# Patient Record
Sex: Female | Born: 1950 | Race: Black or African American | Hispanic: No | Smoking: Former smoker
Health system: Southern US, Community
[De-identification: ages and names within clinical notes are randomized; demographics above are authoritative.]

## PROBLEM LIST (undated history)

## (undated) DIAGNOSIS — R030 Elevated blood-pressure reading, without diagnosis of hypertension: Secondary | ICD-10-CM

## (undated) DIAGNOSIS — C73 Malignant neoplasm of thyroid gland: Secondary | ICD-10-CM

## (undated) DIAGNOSIS — IMO0002 Reserved for concepts with insufficient information to code with codable children: Secondary | ICD-10-CM

## (undated) DIAGNOSIS — K219 Gastro-esophageal reflux disease without esophagitis: Secondary | ICD-10-CM

## (undated) DIAGNOSIS — G43909 Migraine, unspecified, not intractable, without status migrainosus: Secondary | ICD-10-CM

## (undated) HISTORY — DX: Reserved for concepts with insufficient information to code with codable children: IMO0002

## (undated) HISTORY — DX: Elevated blood-pressure reading, without diagnosis of hypertension: R03.0

## (undated) HISTORY — DX: Migraine, unspecified, not intractable, without status migrainosus: G43.909

## (undated) HISTORY — DX: Malignant neoplasm of thyroid gland: C73

## (undated) HISTORY — PX: TUBAL LIGATION: SHX77

---

## 1999-02-02 ENCOUNTER — Other Ambulatory Visit: Admission: RE | Admit: 1999-02-02 | Discharge: 1999-02-02 | Payer: Self-pay | Admitting: *Deleted

## 2001-02-26 ENCOUNTER — Ambulatory Visit (HOSPITAL_COMMUNITY): Admission: RE | Admit: 2001-02-26 | Discharge: 2001-02-26 | Payer: Self-pay | Admitting: Specialist

## 2001-02-26 ENCOUNTER — Encounter: Payer: Self-pay | Admitting: Specialist

## 2001-03-11 ENCOUNTER — Other Ambulatory Visit: Admission: RE | Admit: 2001-03-11 | Discharge: 2001-03-11 | Payer: Self-pay | Admitting: Specialist

## 2002-03-05 ENCOUNTER — Encounter: Payer: Self-pay | Admitting: Specialist

## 2002-03-05 ENCOUNTER — Ambulatory Visit (HOSPITAL_COMMUNITY): Admission: RE | Admit: 2002-03-05 | Discharge: 2002-03-05 | Payer: Self-pay | Admitting: Specialist

## 2003-04-07 ENCOUNTER — Encounter: Payer: Self-pay | Admitting: Specialist

## 2003-04-07 ENCOUNTER — Ambulatory Visit (HOSPITAL_COMMUNITY): Admission: RE | Admit: 2003-04-07 | Discharge: 2003-04-07 | Payer: Self-pay | Admitting: Specialist

## 2004-04-09 ENCOUNTER — Ambulatory Visit (HOSPITAL_COMMUNITY): Admission: RE | Admit: 2004-04-09 | Discharge: 2004-04-09 | Payer: Self-pay | Admitting: Specialist

## 2005-04-11 ENCOUNTER — Ambulatory Visit (HOSPITAL_COMMUNITY): Admission: RE | Admit: 2005-04-11 | Discharge: 2005-04-11 | Payer: Self-pay | Admitting: Obstetrics and Gynecology

## 2006-04-14 ENCOUNTER — Ambulatory Visit (HOSPITAL_COMMUNITY): Admission: RE | Admit: 2006-04-14 | Discharge: 2006-04-14 | Payer: Self-pay | Admitting: Obstetrics and Gynecology

## 2006-04-28 ENCOUNTER — Ambulatory Visit: Payer: Self-pay | Admitting: Gastroenterology

## 2006-04-28 ENCOUNTER — Ambulatory Visit (HOSPITAL_COMMUNITY): Admission: RE | Admit: 2006-04-28 | Discharge: 2006-04-28 | Payer: Self-pay | Admitting: Gastroenterology

## 2006-04-28 ENCOUNTER — Encounter (INDEPENDENT_AMBULATORY_CARE_PROVIDER_SITE_OTHER): Payer: Self-pay | Admitting: *Deleted

## 2007-04-17 ENCOUNTER — Ambulatory Visit (HOSPITAL_COMMUNITY): Admission: RE | Admit: 2007-04-17 | Discharge: 2007-04-17 | Payer: Self-pay | Admitting: Obstetrics and Gynecology

## 2007-04-29 ENCOUNTER — Ambulatory Visit (HOSPITAL_COMMUNITY): Admission: RE | Admit: 2007-04-29 | Discharge: 2007-04-29 | Payer: Self-pay | Admitting: Obstetrics and Gynecology

## 2007-05-14 ENCOUNTER — Encounter (HOSPITAL_COMMUNITY): Admission: RE | Admit: 2007-05-14 | Discharge: 2007-06-13 | Payer: Self-pay | Admitting: Orthopedic Surgery

## 2007-05-19 ENCOUNTER — Other Ambulatory Visit: Admission: RE | Admit: 2007-05-19 | Discharge: 2007-05-19 | Payer: Self-pay | Admitting: Obstetrics and Gynecology

## 2007-11-02 ENCOUNTER — Ambulatory Visit (HOSPITAL_COMMUNITY): Admission: RE | Admit: 2007-11-02 | Discharge: 2007-11-02 | Payer: Self-pay | Admitting: Obstetrics and Gynecology

## 2008-04-25 ENCOUNTER — Ambulatory Visit (HOSPITAL_COMMUNITY): Admission: RE | Admit: 2008-04-25 | Discharge: 2008-04-25 | Payer: Self-pay | Admitting: Obstetrics and Gynecology

## 2008-05-20 ENCOUNTER — Other Ambulatory Visit: Admission: RE | Admit: 2008-05-20 | Discharge: 2008-05-20 | Payer: Self-pay | Admitting: Obstetrics and Gynecology

## 2009-04-26 ENCOUNTER — Ambulatory Visit (HOSPITAL_COMMUNITY): Admission: RE | Admit: 2009-04-26 | Discharge: 2009-04-26 | Payer: Self-pay | Admitting: Obstetrics and Gynecology

## 2009-06-02 ENCOUNTER — Other Ambulatory Visit: Admission: RE | Admit: 2009-06-02 | Discharge: 2009-06-02 | Payer: Self-pay | Admitting: Obstetrics and Gynecology

## 2010-04-27 ENCOUNTER — Ambulatory Visit (HOSPITAL_COMMUNITY): Admission: RE | Admit: 2010-04-27 | Discharge: 2010-04-27 | Payer: Self-pay | Admitting: Obstetrics and Gynecology

## 2010-06-11 ENCOUNTER — Other Ambulatory Visit
Admission: RE | Admit: 2010-06-11 | Discharge: 2010-06-11 | Payer: Self-pay | Source: Home / Self Care | Admitting: Obstetrics and Gynecology

## 2010-11-16 NOTE — Op Note (Signed)
NAMEJAKIERA, Joyce Martin             ACCOUNT NO.:  [id number]   MEDICAL RECORD NO.:  [id number]          PATIENT TYPE:  AMB   LOCATION:  DAY                           FACILITY:  [facility]   PHYSICIAN:  Kassie Mends, M.D.      DATE OF BIRTH:  [birth year]   DATE OF PROCEDURE:  04/28/2006  DATE OF DISCHARGE:  04/28/2006                                 OPERATIVE REPORT   PROCEDURE:  Colonoscopy with cold forceps polypectomy.   INDICATION FOR EXAM:  Ms. Joyce Martin is a 60 year old female who presents for  average risk colon cancer screening.   FINDINGS:  1. 3 mm ascending colon polyp removed via cold forceps.  Otherwise no      masses, inflammatory changes or vascular ectasia seen.  2. Sigmoid colon diverticulosis.  3. Normal retroflexed view of the rectum.   RECOMMENDATIONS:  1. Will call with biopsy results.  If polyp adenomatous, then will need      screening colonoscopy in 5 years.  If the polyp is adenomatous,her      siblings and children will need their first screening colonoscopy at      age 44 and then every 5 years.  2. Handout given on diverticulosis, polyps and high fiber diet.  3. No aspirin, NSAIDs or anticoagulation for 3 days.  4. Follow with Dr. Sudie Bailey.   MEDICATIONS:  1. Demerol 100 mg IV.  2. Versed 6 mg IV.   PROCEDURE TECHNIQUE:  Physical exam was performed and informed consent was  obtained from the patient after explaining all the benefits, risks and  alternatives to the procedure.  The patient was connected to the monitor and  placed in left lateral position.  Continuous oxygen was provided by nasal  cannula and IV medicine administered through an indwelling cannula.  After  administration of sedation and rectal exam, the patient's rectum was  intubated.  The scope was advanced under direct visualization to the cecum.  The scope  was subsequently moved slowly by carefully examining  the color, texture,  anatomy, and integrity mucosa on the way out.  Patient was  recovered in  endoscopy suite and discharged home in satisfactory condition.      Kassie Mends, M.D.  Electronically Signed     SM/MEDQ  D:  04/29/2006  T:  04/30/2006  Job:  [id number]   cc:   Joyce Martin. Sudie Bailey, M.D.  Fax: [phone]

## 2011-02-28 ENCOUNTER — Emergency Department (HOSPITAL_COMMUNITY)
Admission: EM | Admit: 2011-02-28 | Discharge: 2011-02-28 | Disposition: A | Discharge status: Home / Self Care | Payer: 59 | Attending: Emergency Medicine | Admitting: Emergency Medicine

## 2011-02-28 ENCOUNTER — Emergency Department (HOSPITAL_COMMUNITY): Payer: 59

## 2011-02-28 ENCOUNTER — Encounter: Payer: Self-pay | Admitting: Emergency Medicine

## 2011-02-28 DIAGNOSIS — Y92009 Unspecified place in unspecified non-institutional (private) residence as the place of occurrence of the external cause: Secondary | ICD-10-CM | POA: Insufficient documentation

## 2011-02-28 DIAGNOSIS — S61209A Unspecified open wound of unspecified finger without damage to nail, initial encounter: Secondary | ICD-10-CM | POA: Insufficient documentation

## 2011-02-28 DIAGNOSIS — W268XXA Contact with other sharp object(s), not elsewhere classified, initial encounter: Secondary | ICD-10-CM | POA: Insufficient documentation

## 2011-02-28 DIAGNOSIS — S61219A Laceration without foreign body of unspecified finger without damage to nail, initial encounter: Secondary | ICD-10-CM

## 2011-02-28 MED ORDER — LIDOCAINE HCL (PF) 1 % IJ SOLN
INTRAMUSCULAR | Status: AC
  Administered 2011-02-28: 21:00:00
  Filled 2011-02-28: qty 5

## 2011-02-28 MED ORDER — TETANUS-DIPHTH-ACELL PERTUSSIS 5-2.5-18.5 LF-MCG/0.5 IM SUSP
0.5000 mL | Freq: Once | INTRAMUSCULAR | Status: AC
  Administered 2011-02-28: 0.5 mL via INTRAMUSCULAR
  Filled 2011-02-28 (×2): qty 0.5

## 2011-02-28 NOTE — ED Provider Notes (Signed)
History     CSN: [id number] Arrival date & time: 02/28/2011  7:00 PM  Chief Complaint  Patient presents with  . Laceration   HPI Comments: Pt was brushing grass away from the discharge shute on her lawnmower.  Her R 3rd finger was nicked by the blade.  Patient is a 60 y.o. female presenting with skin laceration. The history is provided by the patient. No language interpreter was used.  Laceration  The incident occurred 3 to 5 hours ago. The laceration is located on the right hand. The laceration is 3 cm in size. The pain is moderate. The pain has been constant since onset. She reports no foreign bodies present. Her tetanus status is out of date.    History reviewed. No pertinent past medical history.  Past Surgical History  Procedure Date  . Tubal ligation     Family History  Problem Relation Age of Onset  . Diabetes Mother   . Coronary artery disease Father   . Cancer Sister   . Diabetes Brother     History  Substance Use Topics  . Smoking status: Never Smoker   . Smokeless tobacco: Not on file  . Alcohol Use: No    OB History    Grav Para Term Preterm Abortions TAB SAB Ect Mult Living   2 2 2       2       Review of Systems  Musculoskeletal:       Finger lac  All other systems reviewed and are negative.    Physical Exam  BP 155/81  Pulse 121  Temp(Src) 98.5 F (36.9 C) (Oral)  Resp 18  Ht 5\' 8"  (1.727 m)  Wt 166 lb (75.297 kg)  BMI 25.24 kg/m2  SpO2 100%  Physical Exam  Nursing note and vitals reviewed. Constitutional: She is oriented to person, place, and time. Vital signs are normal. She appears well-developed and well-nourished. No distress.  HENT:  Head: Normocephalic and atraumatic.  Right Ear: External ear normal.  Left Ear: External ear normal.  Nose: Nose normal.  Mouth/Throat: No oropharyngeal exudate.  Eyes: Conjunctivae and EOM are normal. Pupils are equal, round, and reactive to light. Right eye exhibits no discharge. Left eye  exhibits no discharge. No scleral icterus.  Neck: Normal range of motion. Neck supple. No JVD present. No tracheal deviation present. No thyromegaly present.  Cardiovascular: Normal rate, regular rhythm, normal heart sounds, intact distal pulses and normal pulses.  Exam reveals no gallop and no friction rub.   No murmur heard. Pulmonary/Chest: Effort normal and breath sounds normal. No stridor. No respiratory distress. She has no wheezes. She has no rales. She exhibits no tenderness.  Abdominal: Soft. Normal appearance and bowel sounds are normal. She exhibits no distension and no mass. There is no tenderness. There is no rebound and no guarding.  Musculoskeletal: Normal range of motion. She exhibits tenderness. She exhibits no edema.       Hands: Lymphadenopathy:    She has no cervical adenopathy.  Neurological: She is alert and oriented to person, place, and time. She has normal reflexes. No cranial nerve deficit or sensory deficit. Coordination normal. GCS eye subscore is 4. GCS verbal subscore is 5. GCS motor subscore is 6.  Skin: Skin is warm and dry. No rash noted. She is not diaphoretic.  Psychiatric: She has a normal mood and affect. Her speech is normal and behavior is normal. Judgment and thought content normal. Cognition and memory are normal.  ED Course  LACERATION REPAIR Date/Time: 02/28/2011 8:30 PM Performed by: Worthy Rancher Authorized by: Jasmine Awe Consent: Verbal consent obtained. Written consent not obtained. Risks and benefits: risks, benefits and alternatives were discussed Consent given by: patient Patient understanding: patient states understanding of the procedure being performed Imaging studies: imaging studies available Patient identity confirmed: verbally with patient Time out: Immediately prior to procedure a "time out" was called to verify the correct patient, procedure, equipment, support staff and site/side marked as required. Laceration  length: 3 cm Contamination: The wound is contaminated. Foreign bodies: no foreign bodies Tendon involvement: none Nerve involvement: none Vascular damage: no Local anesthetic: lidocaine 1% without epinephrine Patient sedated: no Preparation: Patient was prepped and draped in the usual sterile fashion. Irrigation solution: saline Irrigation method: syringe Debridement: none Degree of undermining: none Dressing: antibiotic ointment and 4x4 sterile gauze (finger pad snipped off and packed with surgicell and then bandage.  ) Patient tolerance: Patient tolerated the procedure well with no immediate complications.    MDM       Worthy Rancher, PA 02/28/11 2045

## 2011-02-28 NOTE — ED Notes (Signed)
avulsion to right middle  finger

## 2011-02-28 NOTE — ED Notes (Signed)
Pt has laceration to right middle finger from lawnmower blade

## 2011-03-05 NOTE — ED Provider Notes (Signed)
Medical screening examination/treatment/procedure(s) were performed by non-physician practitioner and as supervising physician I was immediately available for consultation/collaboration.  Jasmine Awe, MD 03/05/11 [phone]

## 2011-03-25 ENCOUNTER — Other Ambulatory Visit: Payer: Self-pay | Admitting: Adult Health

## 2011-03-25 DIAGNOSIS — Z139 Encounter for screening, unspecified: Secondary | ICD-10-CM

## 2011-04-29 ENCOUNTER — Ambulatory Visit (HOSPITAL_COMMUNITY)
Admission: RE | Admit: 2011-04-29 | Discharge: 2011-04-29 | Disposition: A | Discharge status: Home / Self Care | Payer: 59 | Source: Ambulatory Visit | Attending: Adult Health | Admitting: Adult Health

## 2011-04-29 DIAGNOSIS — Z139 Encounter for screening, unspecified: Secondary | ICD-10-CM

## 2011-04-29 DIAGNOSIS — Z1231 Encounter for screening mammogram for malignant neoplasm of breast: Secondary | ICD-10-CM | POA: Insufficient documentation

## 2011-06-18 ENCOUNTER — Other Ambulatory Visit: Payer: Self-pay | Admitting: Adult Health

## 2011-06-18 ENCOUNTER — Other Ambulatory Visit (HOSPITAL_COMMUNITY)
Admission: RE | Admit: 2011-06-18 | Discharge: 2011-06-18 | Disposition: A | Discharge status: Home / Self Care | Payer: 59 | Source: Ambulatory Visit | Attending: Obstetrics and Gynecology | Admitting: Obstetrics and Gynecology

## 2011-06-18 DIAGNOSIS — Z01419 Encounter for gynecological examination (general) (routine) without abnormal findings: Secondary | ICD-10-CM | POA: Insufficient documentation

## 2012-03-27 ENCOUNTER — Other Ambulatory Visit: Payer: Self-pay | Admitting: Adult Health

## 2012-03-27 DIAGNOSIS — Z139 Encounter for screening, unspecified: Secondary | ICD-10-CM

## 2012-04-30 ENCOUNTER — Ambulatory Visit (HOSPITAL_COMMUNITY)
Admission: RE | Admit: 2012-04-30 | Discharge: 2012-04-30 | Disposition: A | Discharge status: Home / Self Care | Payer: 59 | Source: Ambulatory Visit | Attending: Adult Health | Admitting: Adult Health

## 2012-04-30 DIAGNOSIS — Z1231 Encounter for screening mammogram for malignant neoplasm of breast: Secondary | ICD-10-CM | POA: Insufficient documentation

## 2012-04-30 DIAGNOSIS — Z139 Encounter for screening, unspecified: Secondary | ICD-10-CM

## 2012-07-01 DIAGNOSIS — IMO0002 Reserved for concepts with insufficient information to code with codable children: Secondary | ICD-10-CM

## 2012-07-01 HISTORY — DX: Reserved for concepts with insufficient information to code with codable children: IMO0002

## 2012-07-03 ENCOUNTER — Other Ambulatory Visit (HOSPITAL_COMMUNITY)
Admission: RE | Admit: 2012-07-03 | Discharge: 2012-07-03 | Disposition: A | Discharge status: Home / Self Care | Payer: 59 | Source: Ambulatory Visit | Attending: Obstetrics and Gynecology | Admitting: Obstetrics and Gynecology

## 2012-07-03 ENCOUNTER — Other Ambulatory Visit: Payer: Self-pay | Admitting: Adult Health

## 2012-07-03 DIAGNOSIS — R87810 Cervical high risk human papillomavirus (HPV) DNA test positive: Secondary | ICD-10-CM | POA: Insufficient documentation

## 2012-07-03 DIAGNOSIS — Z01419 Encounter for gynecological examination (general) (routine) without abnormal findings: Secondary | ICD-10-CM | POA: Insufficient documentation

## 2012-07-03 DIAGNOSIS — Z1151 Encounter for screening for human papillomavirus (HPV): Secondary | ICD-10-CM | POA: Insufficient documentation

## 2013-03-22 ENCOUNTER — Other Ambulatory Visit: Payer: Self-pay | Admitting: Adult Health

## 2013-03-22 DIAGNOSIS — Z139 Encounter for screening, unspecified: Secondary | ICD-10-CM

## 2013-05-03 ENCOUNTER — Ambulatory Visit (HOSPITAL_COMMUNITY)
Admission: RE | Admit: 2013-05-03 | Discharge: 2013-05-03 | Disposition: A | Discharge status: Home / Self Care | Payer: 59 | Source: Ambulatory Visit | Attending: Adult Health | Admitting: Adult Health

## 2013-05-03 DIAGNOSIS — Z1231 Encounter for screening mammogram for malignant neoplasm of breast: Secondary | ICD-10-CM | POA: Insufficient documentation

## 2013-05-03 DIAGNOSIS — Z139 Encounter for screening, unspecified: Secondary | ICD-10-CM

## 2013-07-09 ENCOUNTER — Ambulatory Visit (INDEPENDENT_AMBULATORY_CARE_PROVIDER_SITE_OTHER): Payer: 59 | Admitting: Adult Health

## 2013-07-09 ENCOUNTER — Encounter (INDEPENDENT_AMBULATORY_CARE_PROVIDER_SITE_OTHER): Payer: Self-pay

## 2013-07-09 ENCOUNTER — Other Ambulatory Visit (HOSPITAL_COMMUNITY)
Admission: RE | Admit: 2013-07-09 | Discharge: 2013-07-09 | Disposition: A | Discharge status: Home / Self Care | Payer: 59 | Source: Ambulatory Visit | Attending: Adult Health | Admitting: Adult Health

## 2013-07-09 ENCOUNTER — Encounter: Payer: Self-pay | Admitting: Adult Health

## 2013-07-09 VITALS — BP 148/84 | HR 72 | Ht 66.0 in | Wt 176.0 lb

## 2013-07-09 DIAGNOSIS — Z1151 Encounter for screening for human papillomavirus (HPV): Secondary | ICD-10-CM | POA: Insufficient documentation

## 2013-07-09 DIAGNOSIS — IMO0002 Reserved for concepts with insufficient information to code with codable children: Secondary | ICD-10-CM | POA: Insufficient documentation

## 2013-07-09 DIAGNOSIS — Z01419 Encounter for gynecological examination (general) (routine) without abnormal findings: Secondary | ICD-10-CM | POA: Insufficient documentation

## 2013-07-09 DIAGNOSIS — Z1212 Encounter for screening for malignant neoplasm of rectum: Secondary | ICD-10-CM

## 2013-07-09 LAB — HEMOCCULT GUIAC POC 1CARD (OFFICE): Fecal Occult Blood, POC: NEGATIVE

## 2013-07-09 NOTE — Patient Instructions (Signed)
Physical in 1 year Mammogram yearly Colonoscopy  As per GI

## 2013-07-09 NOTE — Progress Notes (Signed)
Patient ID: Joyce Martin, female   DOB: Sep 24, 1950, 63 y.o.   MRN: [id number] History of Present Illness: Joyce Martin is a 63 year old black female in for pap and physical, she had a normal pap las year but had +HPV. No complaints.Walks daily.   Current Medications, Allergies, Past Medical History, Past Surgical History, Family History and Social History were reviewed in [facility] record.     Review of Systems: Patient denies any headaches, blurred vision, shortness of breath, chest pain, abdominal pain, problems with bowel movements, urination, or intercourse.No joint pain or mood swings, does have pain left breast that goes and comes, had normal mammogram in 05/2013.   BP 148/84  Pulse 72  Ht 5\' 6"  (1.676 m)  Wt 176 lb (79.833 kg)  BMI 28.42 kg/m2 Physical Exam: General:  Well developed, well nourished, no acute distress Skin:  Warm and dry Neck:  Midline trachea, normal thyroid, no carotid bruits Lungs; Clear to auscultation bilaterally Breast:  No dominant palpable mass, retraction, or nipple discharge Cardiovascular: Regular rate and rhythm Abdomen:  Soft, non tender, no hepatosplenomegaly Pelvic:  External genitalia is normal in appearance.  The vagina is normal in appearance for age.               The cervix is atrophic, pap performed with HPV.  Uterus is felt to be normal size, shape, and contour.  No  adnexal masses or tenderness noted. Rectal: Good sphincter tone, no polyps, or hemorrhoids felt.  Hemoccult negative.Has mild rectocele Extremities:  No swelling or varicosities noted Psych:  No mood changes, alert and cooperative,seems happy, still working at [employer] hopes to retire this year.   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

## 2014-04-06 ENCOUNTER — Other Ambulatory Visit: Payer: Self-pay | Admitting: Adult Health

## 2014-04-06 DIAGNOSIS — Z1231 Encounter for screening mammogram for malignant neoplasm of breast: Secondary | ICD-10-CM

## 2014-05-02 ENCOUNTER — Encounter: Payer: Self-pay | Admitting: Adult Health

## 2014-05-04 ENCOUNTER — Ambulatory Visit (HOSPITAL_COMMUNITY): Payer: 59

## 2014-05-16 ENCOUNTER — Ambulatory Visit (HOSPITAL_COMMUNITY)
Admission: RE | Admit: 2014-05-16 | Discharge: 2014-05-16 | Disposition: A | Discharge status: Home / Self Care | Payer: 59 | Source: Ambulatory Visit | Attending: Adult Health | Admitting: Adult Health

## 2014-05-16 DIAGNOSIS — Z1231 Encounter for screening mammogram for malignant neoplasm of breast: Secondary | ICD-10-CM

## 2014-07-26 ENCOUNTER — Encounter: Payer: Self-pay | Admitting: Adult Health

## 2014-07-26 ENCOUNTER — Ambulatory Visit (INDEPENDENT_AMBULATORY_CARE_PROVIDER_SITE_OTHER): Payer: 59 | Admitting: Adult Health

## 2014-07-26 VITALS — BP 170/90 | HR 76 | Ht 67.0 in | Wt 179.0 lb

## 2014-07-26 DIAGNOSIS — R030 Elevated blood-pressure reading, without diagnosis of hypertension: Secondary | ICD-10-CM

## 2014-07-26 DIAGNOSIS — IMO0001 Reserved for inherently not codable concepts without codable children: Secondary | ICD-10-CM

## 2014-07-26 DIAGNOSIS — Z1212 Encounter for screening for malignant neoplasm of rectum: Secondary | ICD-10-CM

## 2014-07-26 DIAGNOSIS — Z01419 Encounter for gynecological examination (general) (routine) without abnormal findings: Secondary | ICD-10-CM

## 2014-07-26 HISTORY — DX: Reserved for inherently not codable concepts without codable children: IMO0001

## 2014-07-26 LAB — HEMOCCULT GUIAC POC 1CARD (OFFICE): Fecal Occult Blood, POC: NEGATIVE

## 2014-07-26 NOTE — Patient Instructions (Signed)
Mammogram yearly Physical in 1 year Labs with PCP Follow up with Dr Karie Kirks about BP Colonoscopy per GI

## 2014-07-26 NOTE — Progress Notes (Signed)
Patient ID: Joyce Martin, female   DOB: 1951-03-02, 64 y.o.   MRN: [id number] History of Present Illness: Joyce Martin is a 64 year old black female in for gyn exam and complains of hot flashes esp at night.She had a normal pap with negative HPV 07/09/13.Has had sinus infection and is better, has headache occasionally. May retire next year.  Current Medications, Allergies, Past Medical History, Past Surgical History, Family History and Social History were reviewed in [facility] record.     Review of Systems: Patient denies any daily headaches, blurred vision, shortness of breath, chest pain, abdominal pain, problems with bowel movements, urination, or intercourse. No joint pain or mood swings.See HPI for positives.    Physical Exam:BP 170/90 mmHg  Pulse 76  Ht 5\' 7"  (1.702 m)  Wt 179 lb (81.194 kg)  BMI 28.03 kg/m2BP recheck 170/90 General:  Well developed, well nourished, no acute distress Skin:  Warm and dry Neck:  Midline trachea, normal thyroid Lungs; Clear to auscultation bilaterally Breast:  No dominant palpable mass, retraction, or nipple discharge Cardiovascular: Regular rate and rhythm Abdomen:  Soft, non tender, no hepatosplenomegaly Pelvic:  External genitalia is normal in appearance, no lesions.  The vagina has decrease color, moisture and rugae.The cervix is smooth.  Uterus is felt to be normal size, shape, and contour.  No  adnexal masses or tenderness noted. Rectal: Good sphincter tone, no polyps, or hemorrhoids felt.  Hemoccult negative. Extremities:  No swelling or varicosities noted Psych:  No mood changes,alert and cooperative,seems happy  Impression: Well woman gyn exam no pap Elevated BP    Plan: See Dr Joyce Martin about BP Physical in 1 year Mammogram yearly colonoscopy per GI Labs with PCP

## 2014-10-17 ENCOUNTER — Ambulatory Visit (INDEPENDENT_AMBULATORY_CARE_PROVIDER_SITE_OTHER): Payer: 59

## 2014-10-17 ENCOUNTER — Ambulatory Visit (INDEPENDENT_AMBULATORY_CARE_PROVIDER_SITE_OTHER): Payer: 59 | Admitting: Podiatry

## 2014-10-17 ENCOUNTER — Encounter: Payer: Self-pay | Admitting: Podiatry

## 2014-10-17 VITALS — BP 171/98 | HR 81 | Resp 12

## 2014-10-17 DIAGNOSIS — M7742 Metatarsalgia, left foot: Secondary | ICD-10-CM | POA: Diagnosis not present

## 2014-10-17 DIAGNOSIS — R52 Pain, unspecified: Secondary | ICD-10-CM | POA: Diagnosis not present

## 2014-10-17 NOTE — Patient Instructions (Signed)
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

## 2014-10-17 NOTE — Progress Notes (Signed)
   Subjective:    Patient ID: Joyce Martin, female    DOB: 1950/12/24, 64 y.o.   MRN: [id number]  HPI N-SORE L-LT BALL OF THE FOOT D-6 WEEKS O-SUDDENLY C-SAME A-PRESSURE T-GEL PADS, CUSTOM MADE INSERTS . 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 The pain increases with standing walking and is relieved with rest.  Review of Systems  HENT: Positive for sinus pressure.   Neurological: Positive for headaches.  Hematological: Bruises/bleeds easily.  All other systems reviewed and are negative.      Objective:   Physical Exam  Orientated 3  Vascular: DP and PT pulses 2/bilaterally  Neurological: Ankle reflex equal and reactive bilaterally Vibratory sensation reactive bilaterally Sensation to 10 g monofilament wire intact 5/5 bilaterally  Dermatological: Texture and turgor within normal limits bilaterally  Musculoskeletal: HAV deformities bilaterally Palpable tenderness plantar subsecond MPJ without any palpable lesions Tenderness on range of motion second MPJ left without any crepitus  X-ray examination weightbearing left foot  Intact bony structure without fracture and/or dislocation Posterior and inferior calcaneal spurs Bone density appears adequate Joint spaces appear adequate  Radiographic impression: No acute bony abnormality noted in the left foot       Assessment & Plan:   Assessment: Capsulitis subsecond MPJ/metatarsalgia left  Plan: Advised patient that she has a low-grade inflammation on her left foot that often lingers 6-12 months Dispensed a pair of universal gel wraps for to wear in her shoes  I made aware of the symptoms were not improving also would consider the use of a custom foot orthotic with a metatarsal raise  Reappoint when necessary at patient's request

## 2015-04-17 ENCOUNTER — Other Ambulatory Visit: Payer: Self-pay | Admitting: Adult Health

## 2015-04-17 DIAGNOSIS — Z1231 Encounter for screening mammogram for malignant neoplasm of breast: Secondary | ICD-10-CM

## 2015-04-28 ENCOUNTER — Other Ambulatory Visit (HOSPITAL_COMMUNITY): Payer: Self-pay | Admitting: Family Medicine

## 2015-04-28 DIAGNOSIS — Z780 Asymptomatic menopausal state: Secondary | ICD-10-CM

## 2015-05-03 ENCOUNTER — Ambulatory Visit (HOSPITAL_COMMUNITY)
Admission: RE | Admit: 2015-05-03 | Discharge: 2015-05-03 | Disposition: A | Discharge status: Home / Self Care | Payer: Medicare Other | Source: Ambulatory Visit | Attending: Family Medicine | Admitting: Family Medicine

## 2015-05-03 DIAGNOSIS — Z780 Asymptomatic menopausal state: Secondary | ICD-10-CM | POA: Insufficient documentation

## 2015-05-19 ENCOUNTER — Ambulatory Visit (HOSPITAL_COMMUNITY)
Admission: RE | Admit: 2015-05-19 | Discharge: 2015-05-19 | Disposition: A | Discharge status: Home / Self Care | Payer: Medicare Other | Source: Ambulatory Visit | Attending: Adult Health | Admitting: Adult Health

## 2015-05-19 DIAGNOSIS — Z1231 Encounter for screening mammogram for malignant neoplasm of breast: Secondary | ICD-10-CM | POA: Insufficient documentation

## 2015-05-24 ENCOUNTER — Other Ambulatory Visit (HOSPITAL_COMMUNITY): Payer: Self-pay | Admitting: Family Medicine

## 2015-05-24 ENCOUNTER — Ambulatory Visit (HOSPITAL_COMMUNITY)
Admission: RE | Admit: 2015-05-24 | Discharge: 2015-05-24 | Disposition: A | Discharge status: Home / Self Care | Payer: Medicare Other | Source: Ambulatory Visit | Attending: Family Medicine | Admitting: Family Medicine

## 2015-05-24 DIAGNOSIS — R0789 Other chest pain: Secondary | ICD-10-CM

## 2015-08-07 ENCOUNTER — Encounter: Payer: Self-pay | Admitting: Adult Health

## 2015-08-07 ENCOUNTER — Ambulatory Visit (INDEPENDENT_AMBULATORY_CARE_PROVIDER_SITE_OTHER): Payer: Medicare Other | Admitting: Adult Health

## 2015-08-07 VITALS — BP 140/84 | HR 90 | Ht 66.0 in | Wt 179.0 lb

## 2015-08-07 DIAGNOSIS — Z1212 Encounter for screening for malignant neoplasm of rectum: Secondary | ICD-10-CM | POA: Diagnosis not present

## 2015-08-07 DIAGNOSIS — Z01419 Encounter for gynecological examination (general) (routine) without abnormal findings: Secondary | ICD-10-CM | POA: Diagnosis not present

## 2015-08-07 DIAGNOSIS — R030 Elevated blood-pressure reading, without diagnosis of hypertension: Secondary | ICD-10-CM

## 2015-08-07 DIAGNOSIS — IMO0001 Reserved for inherently not codable concepts without codable children: Secondary | ICD-10-CM

## 2015-08-07 LAB — HEMOCCULT GUIAC POC 1CARD (OFFICE): FECAL OCCULT BLD: NEGATIVE

## 2015-08-07 NOTE — Addendum Note (Signed)
Addended by: Doyne Keel on: 08/07/2015 02:44 PM   Modules accepted: Orders

## 2015-08-07 NOTE — Progress Notes (Signed)
Patient ID: Joyce Martin, female   DOB: 06/06/1951, 65 y.o.   MRN: [id number] History of Present Illness: Joyce Martin is a 65 year old black female in for well woman gyn exam, she had a normal pap with negative HPV 07/09/13.Had sinus headache today, declines flu shot.She has retired. PCP is Dr Karie Kirks.   Current Medications, Allergies, Past Medical History, Past Surgical History, Family History and Social History were reviewed in [employer] record.     Review of Systems: Patient denies any daily headaches, hearing loss, fatigue, blurred vision, shortness of breath, chest pain, abdominal pain, problems with bowel movements, urination, or intercourse. No joint pain or mood swings.    Physical Exam:BP 140/84 mmHg  Pulse 90  Ht 5\' 6"  (1.676 m)  Wt 179 lb (81.194 kg)  BMI 28.91 kg/m2 General:  Well developed, well nourished, no acute distress Skin:  Warm and dry Neck:  Midline trachea, normal thyroid, good ROM, no lymphadenopathy, no carotid bruits heard Lungs; Clear to auscultation bilaterally Breast:  No dominant palpable mass, retraction, or nipple discharge Cardiovascular: Regular rate and rhythm Abdomen:  Soft, non tender, no hepatosplenomegaly Pelvic:  External genitalia is normal in appearance, no lesions.  The vagina has decreased color, moisture and rugae. Urethra has no lesions or masses. The cervix is smooth.  Uterus is felt to be normal size, shape, and contour.  No adnexal masses or tenderness noted.Bladder is non tender, no masses felt. Rectal: Good sphincter tone, no polyps, or hemorrhoids felt.  Hemoccult negative. Extremities/musculoskeletal:  No swelling or varicosities noted, no clubbing or cyanosis Psych:  No mood changes, alert and cooperative,seems happy   Impression: Well woman gyn exam no pap BP elevated     Plan: Labs with PCP Pap and physical in 2 years Mammogram yearly Colonoscopy per GI

## 2015-08-07 NOTE — Patient Instructions (Signed)
Pap and physical in 2 year Mammogram yearly  Labs with PCP  Colonoscopy per GI

## 2016-02-28 ENCOUNTER — Telehealth: Payer: Self-pay | Admitting: Gastroenterology

## 2016-02-28 NOTE — Telephone Encounter (Signed)
Letter mailed

## 2016-02-28 NOTE — Telephone Encounter (Signed)
Pt is due 10 yr colonoscopy °

## 2016-03-06 ENCOUNTER — Telehealth: Payer: Self-pay

## 2016-03-12 NOTE — Telephone Encounter (Signed)
Gastroenterology Pre-Procedure Review  Request Date: 03/06/2016 Requesting Physician: ON RECALL  PATIENT REVIEW QUESTIONS: The patient responded to the following health history questions as indicated:    1. Diabetes Melitis: no 2. Joint replacements in the past 12 months: no 3. Major health problems in the past 3 months: no 4. Has an artificial valve or MVP: no 5. Has a defibrillator: no 6. Has been advised in past to take antibiotics in advance of a procedure like teeth cleaning: no 7. Family history of colon cancer: no  8. Alcohol Use: no 9. History of sleep apnea: no     MEDICATIONS & ALLERGIES:    Patient reports the following regarding taking any blood thinners:   Plavix? no Aspirin? no Coumadin? no  Patient confirms/reports the following medications:  Current Outpatient Prescriptions  Medication Sig Dispense Refill  . ibuprofen (ADVIL,MOTRIN) 200 MG tablet Take 200 mg by mouth as needed.    Marland Kitchen PRESCRIPTION MEDICATION Pt taking medication for acid reflux, unsure of name     No current facility-administered medications for this visit.     Patient confirms/reports the following allergies:  Allergies  Allergen Reactions  . Other     COMBID: for stomach     No orders of the defined types were placed in this encounter.   AUTHORIZATION INFORMATION Primary Insurance:   ID #:  Group #:  Pre-Cert / Auth required: Pre-Cert / Auth #:   Secondary Insurance:   ID #:   Group #:  Pre-Cert / Auth #:   SCHEDULE INFORMATION: Procedure has been scheduled as follows:  Date:              Time:   Location:   This Gastroenterology Pre-Precedure Review Form is being routed to the following provider(s): Barney Drain, MD

## 2016-03-15 NOTE — Telephone Encounter (Signed)
SUPREP SPLIT DOSING-REGULAR breakfast then CLEAR LIQUIDS after 9 am.  HOLD IBUPROFEN ON DAY BEFORE AND DAY OF COLONOSCOPY.

## 2016-03-20 ENCOUNTER — Other Ambulatory Visit: Payer: Self-pay

## 2016-03-20 DIAGNOSIS — Z1211 Encounter for screening for malignant neoplasm of colon: Secondary | ICD-10-CM

## 2016-03-20 MED ORDER — NA SULFATE-K SULFATE-MG SULF 17.5-3.13-1.6 GM/177ML PO SOLN: 1.0000 | ORAL | 0 refills | Status: DC

## 2016-03-20 NOTE — Telephone Encounter (Signed)
Rx sent to the pharmacy and instructions mailed to pt.  

## 2016-03-20 NOTE — Telephone Encounter (Signed)
LMOM to call.

## 2016-04-15 ENCOUNTER — Other Ambulatory Visit: Payer: Self-pay | Admitting: Adult Health

## 2016-04-15 DIAGNOSIS — Z1231 Encounter for screening mammogram for malignant neoplasm of breast: Secondary | ICD-10-CM

## 2016-04-16 ENCOUNTER — Telehealth: Payer: Self-pay

## 2016-04-16 NOTE — Telephone Encounter (Signed)
See documentation on 04/09/2016 by Tretha Sciara for the PA #. For the colonoscopy.

## 2016-04-18 ENCOUNTER — Ambulatory Visit (HOSPITAL_COMMUNITY)
Admission: RE | Admit: 2016-04-18 | Discharge: 2016-04-18 | Disposition: A | Discharge status: Home / Self Care | Payer: Medicare Other | Source: Ambulatory Visit | Attending: Gastroenterology | Admitting: Gastroenterology

## 2016-04-18 ENCOUNTER — Encounter (HOSPITAL_COMMUNITY): Payer: Self-pay | Admitting: *Deleted

## 2016-04-18 ENCOUNTER — Encounter (HOSPITAL_COMMUNITY)
Admission: RE | Disposition: A | Discharge status: Home / Self Care | Payer: Self-pay | Source: Ambulatory Visit | Attending: Gastroenterology

## 2016-04-18 DIAGNOSIS — Z1211 Encounter for screening for malignant neoplasm of colon: Secondary | ICD-10-CM | POA: Diagnosis not present

## 2016-04-18 DIAGNOSIS — K219 Gastro-esophageal reflux disease without esophagitis: Secondary | ICD-10-CM | POA: Diagnosis not present

## 2016-04-18 DIAGNOSIS — Z1212 Encounter for screening for malignant neoplasm of rectum: Secondary | ICD-10-CM

## 2016-04-18 DIAGNOSIS — Z87891 Personal history of nicotine dependence: Secondary | ICD-10-CM | POA: Insufficient documentation

## 2016-04-18 DIAGNOSIS — K648 Other hemorrhoids: Secondary | ICD-10-CM | POA: Insufficient documentation

## 2016-04-18 DIAGNOSIS — D122 Benign neoplasm of ascending colon: Secondary | ICD-10-CM | POA: Diagnosis not present

## 2016-04-18 DIAGNOSIS — K5730 Diverticulosis of large intestine without perforation or abscess without bleeding: Secondary | ICD-10-CM | POA: Insufficient documentation

## 2016-04-18 HISTORY — DX: Gastro-esophageal reflux disease without esophagitis: K21.9

## 2016-04-18 HISTORY — PX: COLONOSCOPY: SHX5424

## 2016-04-18 SURGERY — COLONOSCOPY
Anesthesia: Moderate Sedation

## 2016-04-18 MED ORDER — MEPERIDINE HCL 100 MG/ML IJ SOLN
INTRAMUSCULAR | Status: AC
  Filled 2016-04-18: qty 2

## 2016-04-18 MED ORDER — MEPERIDINE HCL 100 MG/ML IJ SOLN
INTRAMUSCULAR | Status: DC | PRN
  Administered 2016-04-18 (×4): 25 mg via INTRAVENOUS

## 2016-04-18 MED ORDER — MIDAZOLAM HCL 5 MG/5ML IJ SOLN
INTRAMUSCULAR | Status: AC
  Filled 2016-04-18: qty 10

## 2016-04-18 MED ORDER — SODIUM CHLORIDE 0.9 % IV SOLN
INTRAVENOUS | Status: DC
  Administered 2016-04-18: 09:00:00 via INTRAVENOUS

## 2016-04-18 MED ORDER — MIDAZOLAM HCL 5 MG/5ML IJ SOLN
INTRAMUSCULAR | Status: DC | PRN
Start: 2016-04-18 — End: 2016-04-18
  Administered 2016-04-18: 2 mg via INTRAVENOUS
  Administered 2016-04-18: 1 mg via INTRAVENOUS
  Administered 2016-04-18: 2 mg via INTRAVENOUS

## 2016-04-18 MED ORDER — STERILE WATER FOR IRRIGATION IR SOLN
Status: DC | PRN
  Administered 2016-04-18: 10:00:00

## 2016-04-18 NOTE — Discharge Instructions (Signed)
You have MODERATE linternal hemorrhoids, WHICH CAN CAUSE RECTAL BLEEDING. YOU HAVE diverticulosis IN YOUR LEFT COLON. ONE SMALL POLYP WAS REMOVED.    DRINK WATER TO KEEP YOUR URINE LIGHT YELLOW.  FOLLOW A HIGH FIBER DIET. AVOID ITEMS THAT CAUSE BLOATING. See info below.  USE PREPARATION H FOUR TIMES  A DAY IF NEEDED TO RELIEVE RECTAL PAIN/PRESSURE/BLEEDING.   YOUR BIOPSY RESULTS WILL BE AVAILABLE IN MY CHART  OCT 23 AND MY OFFICE WILL CONTACT YOU IN 10-14 DAYS WITH YOUR RESULTS.   Next colonoscopy in 5-10 years.  Colonoscopy Care After Read the instructions outlined below and refer to this sheet in the next week. These discharge instructions provide you with general information on caring for yourself after you leave the hospital. While your treatment has been planned according to the most current medical practices available, unavoidable complications occasionally occur. If you have any problems or questions after discharge, call DR. Claudell Wohler, [phone].  ACTIVITY  You may resume your regular activity, but move at a slower pace for the next 24 hours.   Take frequent rest periods for the next 24 hours.   Walking will help get rid of the air and reduce the bloated feeling in your belly (abdomen).   No driving for 24 hours (because of the medicine (anesthesia) used during the test).   You may shower.   Do not sign any important legal documents or operate any machinery for 24 hours (because of the anesthesia used during the test).    NUTRITION  Drink plenty of fluids.   You may resume your normal diet as instructed by your doctor.   Begin with a light meal and progress to your normal diet. Heavy or fried foods are harder to digest and may make you feel sick to your stomach (nauseated).   Avoid alcoholic beverages for 24 hours or as instructed.    MEDICATIONS  You may resume your normal medications.   WHAT YOU CAN EXPECT TODAY  Some feelings of bloating in the abdomen.     Passage of more gas than usual.   Spotting of blood in your stool or on the toilet paper  .  IF YOU HAD POLYPS REMOVED DURING THE COLONOSCOPY:  Eat a soft diet IF YOU HAVE NAUSEA, BLOATING, ABDOMINAL PAIN, OR VOMITING.    FINDING OUT THE RESULTS OF YOUR TEST Not all test results are available during your visit. DR. Oneida Alar WILL CALL YOU WITHIN 14 DAYS OF YOUR PROCEDUE WITH YOUR RESULTS. Do not assume everything is normal if you have not heard from DR. Armen Waring, CALL HER OFFICE AT [phone].  SEEK IMMEDIATE MEDICAL ATTENTION AND CALL THE OFFICE: [phone] IF:  You have more than a spotting of blood in your stool.   Your belly is swollen (abdominal distention).   You are nauseated or vomiting.   You have a temperature over 101F.   You have abdominal pain or discomfort that is severe or gets worse throughout the day.  High-Fiber Diet A high-fiber diet changes your normal diet to include more whole grains, legumes, fruits, and vegetables. Changes in the diet involve replacing refined carbohydrates with unrefined foods. The calorie level of the diet is essentially unchanged. The Dietary Reference Intake (recommended amount) for adult males is 38 grams per day. For adult females, it is 25 grams per day. Pregnant and lactating women should consume 28 grams of fiber per day. Fiber is the intact part of a plant that is not broken down during digestion. Functional fiber  is fiber that has been isolated from the plant to provide a beneficial effect in the body. PURPOSE  Increase stool bulk.   Ease and regulate bowel movements.   Lower cholesterol.  REDUCE RISK OF COLON CANCER  INDICATIONS THAT YOU NEED MORE FIBER  Constipation and hemorrhoids.   Uncomplicated diverticulosis (intestine condition) and irritable bowel syndrome.   Weight management.   As a protective measure against hardening of the arteries (atherosclerosis), diabetes, and cancer.   GUIDELINES FOR INCREASING  FIBER IN THE DIET  Start adding fiber to the diet slowly. A gradual increase of about 5 more grams (2 slices of whole-wheat bread, 2 servings of most fruits or vegetables, or 1 bowl of high-fiber cereal) per day is best. Too rapid an increase in fiber may result in constipation, flatulence, and bloating.   Drink enough water and fluids to keep your urine clear or pale yellow. Water, juice, or caffeine-free drinks are recommended. Not drinking enough fluid may cause constipation.   Eat a variety of high-fiber foods rather than one type of fiber.   Try to increase your intake of fiber through using high-fiber foods rather than fiber pills or supplements that contain small amounts of fiber.   The goal is to change the types of food eaten. Do not supplement your present diet with high-fiber foods, but replace foods in your present diet.   INCLUDE A VARIETY OF FIBER SOURCES  Replace refined and processed grains with whole grains, canned fruits with fresh fruits, and incorporate other fiber sources. White rice, white breads, and most bakery goods contain little or no fiber.   Brown whole-grain rice, buckwheat oats, and many fruits and vegetables are all good sources of fiber. These include: broccoli, Brussels sprouts, cabbage, cauliflower, beets, sweet potatoes, white potatoes (skin on), carrots, tomatoes, eggplant, squash, berries, fresh fruits, and dried fruits.   Cereals appear to be the richest source of fiber. Cereal fiber is found in whole grains and bran. Bran is the fiber-rich outer coat of cereal grain, which is largely removed in refining. In whole-grain cereals, the bran remains. In breakfast cereals, the largest amount of fiber is found in those with "bran" in their names. The fiber content is sometimes indicated on the label.   You may need to include additional fruits and vegetables each day.   In baking, for 1 cup white flour, you may use the following substitutions:   1 cup  whole-wheat flour minus 2 tablespoons.   1/2 cup white flour plus 1/2 cup whole-wheat flour.   Polyps, Colon  A polyp is extra tissue that grows inside your body. Colon polyps grow in the large intestine. The large intestine, also called the colon, is part of your digestive system. It is a long, hollow tube at the end of your digestive tract where your body makes and stores stool. Most polyps are not dangerous. They are benign. This means they are not cancerous. But over time, some types of polyps can turn into cancer. Polyps that are smaller than a pea are usually not harmful. But larger polyps could someday become or may already be cancerous. To be safe, doctors remove all polyps and test them.   PREVENTION There is not one sure way to prevent polyps. You might be able to lower your risk of getting them if you:  Eat more fruits and vegetables and less fatty food.   Do not smoke.   Avoid alcohol.   Exercise every day.   Lose weight if  you are overweight.   Eating more calcium and folate can also lower your risk of getting polyps. Some foods that are rich in calcium are milk, cheese, and broccoli. Some foods that are rich in folate are chickpeas, kidney beans, and spinach.    Diverticulosis Diverticulosis is a common condition that develops when small pouches (diverticula) form in the wall of the colon. The risk of diverticulosis increases with age. It happens more often in people who eat a low-fiber diet. Most individuals with diverticulosis have no symptoms. Those individuals with symptoms usually experience belly (abdominal) pain, constipation, or loose stools (diarrhea).  HOME CARE INSTRUCTIONS  Increase the amount of fiber in your diet as directed by your caregiver or dietician. This may reduce symptoms of diverticulosis.   Drink at least 6 to 8 glasses of water each day to prevent constipation.   Try not to strain when you have a bowel movement.   Avoiding nuts and seeds to  prevent complications is NOT NECESSARY.     FOODS HAVING HIGH FIBER CONTENT INCLUDE:  Fruits. Apple, peach, pear, tangerine, raisins, prunes.   Vegetables. Brussels sprouts, asparagus, broccoli, cabbage, carrot, cauliflower, romaine lettuce, spinach, summer squash, tomato, winter squash, zucchini.   Starchy Vegetables. Baked beans, kidney beans, lima beans, split peas, lentils, potatoes (with skin).   Grains. Whole wheat bread, brown rice, bran flake cereal, plain oatmeal, white rice, shredded wheat, bran muffins.    SEEK IMMEDIATE MEDICAL CARE IF:  You develop increasing pain or severe bloating.   You have an oral temperature above 101F.   You develop vomiting or bowel movements that are bloody or black.   Hemorrhoids Hemorrhoids are dilated (enlarged) veins around the rectum. Sometimes clots will form in the veins. This makes them swollen and painful. These are called thrombosed hemorrhoids. Causes of hemorrhoids include:  Constipation.   Straining to have a bowel movement.   HEAVY LIFTING  HOME CARE INSTRUCTIONS  Eat a well balanced diet and drink 6 to 8 glasses of water every day to avoid constipation. You may also use a bulk laxative.   Avoid straining to have bowel movements.   Keep anal area dry and clean.   Do not use a donut shaped pillow or sit on the toilet for long periods. This increases blood pooling and pain.   Move your bowels when your body has the urge; this will require less straining and will decrease pain and pressure.

## 2016-04-18 NOTE — H&P (Signed)
Primary Care Physician:  Robert Bellow, MD Primary Gastroenterologist:  Dr. Oneida Alar  Pre-Procedure History & Physical: HPI:  Joyce Martin is a 65 y.o. female here for [place].  Past Medical History:  Diagnosis Date  . Elevated BP 07/26/2014  . GERD (gastroesophageal reflux disease)   . Migraines   . 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 TUBAL LIGATION      Prior to Admission medications   Medication Sig Start Date End Date Taking? Authorizing Provider  cromolyn (OPTICROM) 4 % ophthalmic solution Place 1 drop into both eyes every other day.   Yes Historical Provider, MD  ibuprofen (ADVIL,MOTRIN) 200 MG tablet Take 400 mg by mouth daily as needed for headache.    Yes Historical Provider, MD  Na Sulfate-K Sulfate-Mg Sulf (SUPREP BOWEL PREP KIT) 17.5-3.13-1.6 GM/180ML SOLN Take 1 kit by mouth as directed. 03/20/16  Yes Danie Binder, MD  pantoprazole (PROTONIX) 40 MG tablet Take 40 mg by mouth daily as needed (indigestion).   Yes Historical Provider, MD    Allergies as of 03/20/2016 - Review Complete 03/06/2016  Allergen Reaction Noted  . Other  02/28/2011    Family History  Problem Relation Age of Onset  . Diabetes Mother   . Hypertension Mother   . Heart disease Father     heart attack  . Cancer Sister     gallbladder  . Diabetes Brother   . Diabetes Daughter     borderline  . Diabetes Maternal Grandmother   . Diabetes Paternal Grandmother   . Diabetes Paternal Grandfather   . COPD Brother   . Hypertension Brother   . Parkinson's disease Brother   . Cancer Brother   . Colon cancer Neg Hx     Social History   Social History  . Marital status: Divorced    Spouse name: N/A  . Number of children: N/A  . Years of education: N/A   Occupational History  . Not on file.   Social History Main Topics  . Smoking status: Former Smoker    Packs/day: 0.25    Years:  5.00    Types: Cigarettes  . Smokeless tobacco: Never Used  . Alcohol use No  . Drug use: No  . Sexual activity: Yes    Birth control/ protection: Post-menopausal   Other Topics Concern  . Not on file   Social History Narrative  . No narrative on file    Review of Systems: See HPI, otherwise negative ROS   Physical Exam: BP (!) 182/90   Pulse (!) 110   Temp 97.9 F (36.6 C) (Oral)   Resp 20   Ht _0  (1.727 m)   Wt 162 lb (73.5 kg)   SpO2 100%   BMI 24.63 kg/m  General:   Alert,  pleasant and cooperative in NAD Head:  Normocephalic and atraumatic. Neck:  Supple; Lungs:  Clear throughout to auscultation.    Heart:  Regular rate and rhythm. Abdomen:  Soft, nontender and nondistended. Normal bowel sounds, without guarding, and without rebound.   Neurologic:  Alert and  oriented x4;  grossly normal neurologically.  Impression/Plan:     SCREENING  Plan:  1. TCS TODAY. DISCUSSED PROCEDURE, BENEFITS, & RISKS: < 1% chance of medication reaction, bleeding, perforation, or rupture of spleen/liver.

## 2016-04-18 NOTE — Op Note (Signed)
[facility] Patient Name: Joyce Martin Procedure Date: 04/18/2016 9:33 AM MRN: [id number] Date of Birth: [birth year] Attending MD: Barney Drain , MD CSN: [id number] Age: 65 Admit Type: Outpatient Procedure:                Colonoscopy with COLD FORCEPS POLYPECTOMY Indications:              Screening for colorectal malignant neoplasm Providers:                Barney Drain, MD, Janeece Riggers, RN, Isabella Stalling,                            Technician Referring MD:             Newt Minion, MD Medicines:                Meperidine 100 mg IV, Midazolam 5 mg IV Complications:            No immediate complications. Estimated Blood Loss:     Estimated blood loss was minimal. Procedure:                Pre-Anesthesia Assessment:                           - Prior to the procedure, a History and Physical                            was performed, and patient medications and                            allergies were reviewed. The patient's tolerance of                            previous anesthesia was also reviewed. The risks                            and benefits of the procedure and the sedation                            options and risks were discussed with the patient.                            All questions were answered, and informed consent                            was obtained. Prior Anticoagulants: The patient has                            taken ibuprofen, last dose was 1 day prior to                            procedure. ASA Grade Assessment: I - A normal,                            healthy patient. After reviewing the risks and  benefits, the patient was deemed in satisfactory                            condition to undergo the procedure. After obtaining                            informed consent, the colonoscope was passed under                            direct vision. Throughout the procedure, the                            patient's blood  pressure, pulse, and oxygen                            saturations were monitored continuously. The                            [id number] scope was introduced through                            the anus and advanced to the the cecum, identified                            by appendiceal orifice and ileocecal valve. The                            ileocecal valve, appendiceal orifice, and rectum                            were photographed. The colonoscopy was somewhat                            difficult due to restricted mobility of the colon.                            Successful completion of the procedure was aided by                            straightening and shortening the scope to obtain                            bowel loop reduction and COLOWRAP. The patient                            tolerated the procedure well. The quality of the                            bowel preparation was excellent. Scope In: 10:00:57 AM Scope Out: 10:15:58 AM Scope Withdrawal Time: 0 hours 11 minutes 9 seconds  Total Procedure Duration: 0 hours 15 minutes 1 second  Findings:      A 3 mm polyp was found in the mid ascending colon. The polyp was  sessile. The polyp was removed with a cold biopsy forceps. Resection and       retrieval were complete.      Many small and large-mouthed diverticula were found in the sigmoid colon       and descending colon.      Non-bleeding internal hemorrhoids were found. The hemorrhoids were       moderate. Impression:               - One 3 mm polyp in the mid ascending colon,                            removed with a cold biopsy forceps. .                           - Diverticulosis in the sigmoid colon and in the                            descending colon.                           - Non-bleeding internal hemorrhoids. Moderate Sedation:      Moderate (conscious) sedation was administered by the endoscopy nurse       and supervised by the endoscopist. The  following parameters were       monitored: oxygen saturation, heart rate, blood pressure, and response       to care. Total physician intraservice time was 34 minutes. Recommendation:           - High fiber diet.                           - Continue present medications.                           - Await pathology results.                           - Repeat colonoscopy in 5-10 years for surveillance.                           - Patient has a contact number available for                            emergencies. The signs and symptoms of potential                            delayed complications were discussed with the                            patient. Return to normal activities tomorrow.                            Written discharge instructions were provided to the                            patient. Procedure Code(s):        --- Professional ---  [id number], Colonoscopy, flexible; with biopsy, single                            or multiple                           99152, Moderate sedation services provided by the                            same physician or other qualified health care                            professional performing the diagnostic or                            therapeutic service that the sedation supports,                            requiring the presence of an independent trained                            observer to assist in the monitoring of the                            patient's level of consciousness and physiological                            status; initial 15 minutes of intraservice time,                            patient age 25 years or older                           [id number], Moderate sedation services; each additional                            15 minutes intraservice time Diagnosis Code(s):        --- Professional ---                           Z12.11, Encounter for screening for malignant                            neoplasm of  colon                           D12.2, Benign neoplasm of ascending colon                           K64.8, Other hemorrhoids                           K57.30, Diverticulosis of large intestine without                            perforation or abscess without bleeding CPT  copyright 2016 American Medical Association. All rights reserved. The codes documented in this report are preliminary and upon coder review may  be revised to meet current compliance requirements. Barney Drain, MD Barney Drain, MD 04/18/2016 10:24:50 AM This report has been signed electronically. Number of Addenda: 0

## 2016-04-23 ENCOUNTER — Encounter (HOSPITAL_COMMUNITY): Payer: Self-pay | Admitting: Gastroenterology

## 2016-05-09 ENCOUNTER — Telehealth: Payer: Self-pay | Admitting: Gastroenterology

## 2016-05-09 NOTE — Telephone Encounter (Signed)
Please call pt. She had a simple adenoma removed from her colon.   DRINK WATER TO KEEP YOUR URINE LIGHT YELLOW.  FOLLOW A HIGH FIBER DIET. AVOID ITEMS THAT CAUSE BLOATING.   USE PREPARATION H FOUR TIMES  A DAY IF NEEDED TO RELIEVE RECTAL PAIN/PRESSURE/BLEEDING.   Next colonoscopy in 5-10 years.

## 2016-05-09 NOTE — Telephone Encounter (Signed)
Reminder in epic °

## 2016-05-09 NOTE — Telephone Encounter (Signed)
Pt is aware.  

## 2016-05-09 NOTE — Telephone Encounter (Signed)
LMOM to call back

## 2016-05-20 ENCOUNTER — Ambulatory Visit (HOSPITAL_COMMUNITY)
Admission: RE | Admit: 2016-05-20 | Discharge: 2016-05-20 | Disposition: A | Discharge status: Home / Self Care | Payer: Medicare Other | Source: Ambulatory Visit | Attending: Adult Health | Admitting: Adult Health

## 2016-05-20 DIAGNOSIS — Z1231 Encounter for screening mammogram for malignant neoplasm of breast: Secondary | ICD-10-CM | POA: Insufficient documentation

## 2016-10-01 LAB — TSH: TSH: 1.6 u[IU]/mL (ref <=5.90)

## 2016-10-16 ENCOUNTER — Other Ambulatory Visit (HOSPITAL_COMMUNITY): Payer: Self-pay | Admitting: Family Medicine

## 2016-10-16 DIAGNOSIS — E069 Thyroiditis, unspecified: Secondary | ICD-10-CM

## 2016-10-23 ENCOUNTER — Encounter (HOSPITAL_COMMUNITY): Payer: Self-pay

## 2016-10-23 ENCOUNTER — Encounter (HOSPITAL_COMMUNITY)
Admission: RE | Admit: 2016-10-23 | Discharge: 2016-10-23 | Disposition: A | Discharge status: Home / Self Care | Payer: Medicare Other | Source: Ambulatory Visit | Attending: Family Medicine | Admitting: Family Medicine

## 2016-10-23 DIAGNOSIS — E041 Nontoxic single thyroid nodule: Secondary | ICD-10-CM | POA: Insufficient documentation

## 2016-10-23 DIAGNOSIS — E069 Thyroiditis, unspecified: Secondary | ICD-10-CM | POA: Insufficient documentation

## 2016-10-23 MED ORDER — SODIUM IODIDE I-123 7.4 MBQ CAPS
400.0000 | ORAL_CAPSULE | Freq: Once | ORAL | Status: AC
  Administered 2016-10-23: 368 via ORAL

## 2016-10-24 ENCOUNTER — Encounter (HOSPITAL_COMMUNITY)
Admission: RE | Admit: 2016-10-24 | Discharge: 2016-10-24 | Disposition: A | Discharge status: Home / Self Care | Payer: Medicare Other | Source: Ambulatory Visit | Attending: Family Medicine | Admitting: Family Medicine

## 2016-10-24 DIAGNOSIS — E041 Nontoxic single thyroid nodule: Secondary | ICD-10-CM | POA: Diagnosis not present

## 2016-10-24 DIAGNOSIS — E069 Thyroiditis, unspecified: Secondary | ICD-10-CM | POA: Diagnosis present

## 2016-11-29 DIAGNOSIS — C73 Malignant neoplasm of thyroid gland: Secondary | ICD-10-CM

## 2016-11-29 HISTORY — DX: Malignant neoplasm of thyroid gland: C73

## 2016-12-03 ENCOUNTER — Encounter: Payer: Self-pay | Admitting: "Endocrinology

## 2016-12-03 ENCOUNTER — Ambulatory Visit (INDEPENDENT_AMBULATORY_CARE_PROVIDER_SITE_OTHER): Payer: Medicare Other | Admitting: "Endocrinology

## 2016-12-03 VITALS — BP 149/90 | HR 82 | Ht 66.0 in | Wt 179.0 lb

## 2016-12-03 DIAGNOSIS — E0510 Thyrotoxicosis with toxic single thyroid nodule without thyrotoxic crisis or storm: Secondary | ICD-10-CM

## 2016-12-03 NOTE — Progress Notes (Signed)
Subjective:    Patient ID: Joyce Martin, female    DOB: [birth year], PCP Lemmie Evens, MD   Past Medical History:  Diagnosis Date  . Elevated BP 07/26/2014  . GERD (gastroesophageal reflux disease)   . Migraines   . 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 COLONOSCOPY N/A 04/18/2016   Procedure: COLONOSCOPY;  Surgeon: Danie Binder, MD;  Location: [facility];  Service: Endoscopy;  Laterality: N/A;  9:30 AM  . TUBAL LIGATION     Social History   Social History  . Marital status: Divorced    Spouse name: N/A  . Number of children: N/A  . Years of education: N/A   Social History Main Topics  . Smoking status: Former Smoker    Packs/day: 0.25    Years: 5.00    Types: Cigarettes  . Smokeless tobacco: Never Used  . Alcohol use No  . Drug use: No  . Sexual activity: Yes    Birth control/ protection: Post-menopausal   Other Topics Concern  . None   Social History Narrative  . None   Outpatient Encounter Prescriptions as of 12/03/2016  Medication Sig  . cromolyn (OPTICROM) 4 % ophthalmic solution Place 1 drop into both eyes every other day.  . ibuprofen (ADVIL,MOTRIN) 200 MG tablet Take 400 mg by mouth daily as needed for headache.   . pantoprazole (PROTONIX) 40 MG tablet Take 40 mg by mouth daily as needed (indigestion).   No facility-administered encounter medications on file as of 12/03/2016.    ALLERGIES: Allergies  Allergen Reactions  . Other     COMBID: for stomach     VACCINATION STATUS: Immunization History  Administered Date(s) Administered  . Tdap 02/28/2011    HPI  66 year old female patient with medical history as above. She is being seen in consultation for toxic nodule on the left lower her thyroid requested by Dr. Karie Kirks. -She denies any prior history of thyroid dysfunction. She is not currently on thyroid hormone supplements nor antithyroid therapy. She  denies family history of thyroid dysfunction. She was found to have warm nodule was 22% uptake on nuclear medicine test on her thyroid on 10/24/2016. - Heart thyroid function tests from 10/01/2016 showed TSH 1.6, total T4 9 0.2. - She has vaguely localized pain on the right side of her neck anteriorly. -She is scheduled to see Dr. Benjamine Mola for same. - She denies dysphagia, shortness of breath, voice change. - She has fluctuating body weight however gained 15 pounds over the last year.  - She denies palpitations, heat/cold intolerance, and tremors.  Review of Systems  Constitutional: no weight gain/loss, no fatigue, no subjective hyperthermia, no subjective hypothermia Eyes: no blurry vision, no xerophthalmia ENT: no sore throat, no nodules palpated in throat, no dysphagia/odynophagia, no hoarseness Cardiovascular: no Chest Pain, no Shortness of Breath, no palpitations, no leg swelling Respiratory: no cough, no SOB Gastrointestinal: no Nausea/Vomiting/Diarhhea Musculoskeletal: no muscle/joint aches Skin: no rashes Neurological: no tremors, no numbness, no tingling, no dizziness Psychiatric: no depression, no anxiety  Objective:    BP (!) 149/90   Pulse 82   Ht 5\' 6"  (1.676 m)   Wt 179 lb (81.2 kg)   BMI 28.89 kg/m   Wt Readings from Last 3 Encounters:  12/03/16 179 lb (81.2 kg)  04/18/16 162 lb (73.5 kg)  08/07/15 179 lb (81.2 kg)    Physical Exam  Constitutional:  Slightly over weight for height, not in acute distress, normal state of mind Eyes: PERRLA, EOMI, no exophthalmos ENT: moist mucous membranes, mild palpable thyromegaly, no cervical lymphadenopathy Cardiovascular: normal precordial activity, Regular Rate and Rhythm, no Murmur/Rubs/Gallops Respiratory:  adequate breathing efforts, no gross chest deformity, Clear to auscultation bilaterally Gastrointestinal: abdomen soft, Non -tender, No distension, Bowel Sounds present Musculoskeletal: no gross deformities, strength  intact in all four extremities Skin: moist, warm, no rashes Neurological:  + mild tremor with outstretched hands, Deep tendon reflexes normal in all four extremities.  Labs from 10/01/2016 showed TSH 1.6, total T4 9 0.2. - Review of her thyroid uptake and scan from 10/24/2016 showed 24-hour uptake of 22% with a single warm nodule within the left thyroid lobe.    Assessment & Plan:   1. Toxic solitary thyroid nodule - She is clinically not specifically symptomatic of hyperthyroidism at this time. I will proceed to obtain full profile thyroid function tests including TSH/free T4, free T3, TPO antibodies and thyroglobulin antibodies. - If this confirms hyperthyroidism, she will be considered for ablative therapy with I-131. - Given her palpable thyroid and vaguely described neck pain, I will also obtain dedicated thyroid ultrasound.   - I advised patient to maintain close follow up with Lemmie Evens, MD for primary care needs. Follow up plan: Return in about 2 weeks (around 12/17/2016) for labs today, Thyroid / Neck Ultrasound.  Glade Lloyd, MD Phone: [phone]  Fax: [phone]   12/03/2016, 3:36 PM

## 2016-12-04 LAB — T3, FREE: T3 FREE: 2.9 pg/mL (ref 2.3–4.2)

## 2016-12-04 LAB — TSH: TSH: 1.25 mIU/L

## 2016-12-04 LAB — THYROID PEROXIDASE ANTIBODY

## 2016-12-04 LAB — T4, FREE: FREE T4: 1.2 ng/dL (ref 0.8–1.8)

## 2016-12-04 LAB — THYROGLOBULIN ANTIBODY: Thyroglobulin Ab: 1 IU/mL (ref <2)

## 2016-12-05 ENCOUNTER — Ambulatory Visit (INDEPENDENT_AMBULATORY_CARE_PROVIDER_SITE_OTHER): Payer: Medicare Other | Admitting: Otolaryngology

## 2016-12-05 DIAGNOSIS — K219 Gastro-esophageal reflux disease without esophagitis: Secondary | ICD-10-CM

## 2016-12-05 DIAGNOSIS — R070 Pain in throat: Secondary | ICD-10-CM

## 2016-12-05 DIAGNOSIS — R490 Dysphonia: Secondary | ICD-10-CM

## 2016-12-16 ENCOUNTER — Ambulatory Visit (HOSPITAL_COMMUNITY)
Admission: RE | Admit: 2016-12-16 | Discharge: 2016-12-16 | Disposition: A | Discharge status: Home / Self Care | Payer: Medicare Other | Source: Ambulatory Visit | Attending: "Endocrinology | Admitting: "Endocrinology

## 2016-12-16 DIAGNOSIS — E0510 Thyrotoxicosis with toxic single thyroid nodule without thyrotoxic crisis or storm: Secondary | ICD-10-CM | POA: Diagnosis present

## 2016-12-16 DIAGNOSIS — E042 Nontoxic multinodular goiter: Secondary | ICD-10-CM | POA: Diagnosis not present

## 2016-12-17 ENCOUNTER — Encounter: Payer: Self-pay | Admitting: "Endocrinology

## 2016-12-17 ENCOUNTER — Ambulatory Visit (INDEPENDENT_AMBULATORY_CARE_PROVIDER_SITE_OTHER): Payer: Medicare Other | Admitting: "Endocrinology

## 2016-12-17 VITALS — BP 138/78 | HR 85 | Ht 66.0 in | Wt 175.0 lb

## 2016-12-17 DIAGNOSIS — E049 Nontoxic goiter, unspecified: Secondary | ICD-10-CM

## 2016-12-17 MED ORDER — PANTOPRAZOLE SODIUM 40 MG PO TBEC: 40.0000 mg | DELAYED_RELEASE_TABLET | Freq: Every day | ORAL | 1 refills | Status: DC

## 2016-12-17 NOTE — Progress Notes (Signed)
Subjective:    Patient ID: Joyce Martin, female    DOB: [birth year], PCP Lemmie Evens, MD   Past Medical History:  Diagnosis Date  . Elevated BP 07/26/2014  . GERD (gastroesophageal reflux disease)   . Migraines   . 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 COLONOSCOPY N/A 04/18/2016   Procedure: COLONOSCOPY;  Surgeon: Danie Binder, MD;  Location: [facility];  Service: Endoscopy;  Laterality: N/A;  9:30 AM  . TUBAL LIGATION     Social History   Social History  . Marital status: Divorced    Spouse name: N/A  . Number of children: N/A  . Years of education: N/A   Social History Main Topics  . Smoking status: Former Smoker    Packs/day: 0.25    Years: 5.00    Types: Cigarettes  . Smokeless tobacco: Never Used  . Alcohol use No  . Drug use: No  . Sexual activity: Yes    Birth control/ protection: Post-menopausal   Other Topics Concern  . None   Social History Narrative  . None   Outpatient Encounter Prescriptions as of 12/17/2016  Medication Sig  . cromolyn (OPTICROM) 4 % ophthalmic solution Place 1 drop into both eyes every other day.  . ibuprofen (ADVIL,MOTRIN) 200 MG tablet Take 400 mg by mouth daily as needed for headache.   . pantoprazole (PROTONIX) 40 MG tablet Take 1 tablet (40 mg total) by mouth daily.  . [DISCONTINUED] pantoprazole (PROTONIX) 40 MG tablet Take 40 mg by mouth daily as needed (indigestion).   No facility-administered encounter medications on file as of 12/17/2016.    ALLERGIES: Allergies  Allergen Reactions  . Other     COMBID: for stomach     VACCINATION STATUS: Immunization History  Administered Date(s) Administered  . Tdap 02/28/2011    HPI  66 year old female patient with medical history as above. She is being seen in  Follow-up for toxic nodule on the left lower her thyroid . -She denies any prior history of thyroid dysfunction. She is not  currently on thyroid hormone supplements nor antithyroid therapy. She denies family history of thyroid dysfunction. She was found to have warm nodule was 22% uptake on nuclear medicine test on her thyroid on 10/24/2016. - Heart thyroid function tests from 10/01/2016 showed TSH 1.6, total T4 9 0.2. - She has vaguely localized pain on the right side of her neck anteriorly. -She is scheduled to see Dr. Benjamine Mola for same. - She denies dysphagia, shortness of breath, voice change. - She has fluctuating body weight however gained 15 pounds over the last year.  - She denies palpitations, heat/cold intolerance, and tremors.  Review of Systems  Constitutional: + weight loss, no fatigue, no subjective hyperthermia, no subjective hypothermia Eyes: no blurry vision, no xerophthalmia ENT: no sore throat, no nodules palpated in throat, no dysphagia/odynophagia, no hoarseness Cardiovascular: no Chest Pain, no Shortness of Breath, no palpitations, no leg swelling Respiratory: no cough, no SOB Gastrointestinal: no Nausea/Vomiting/Diarhhea Musculoskeletal: no muscle/joint aches Skin: no rashes Neurological: no tremors, no numbness, no tingling, no dizziness Psychiatric: no depression, no anxiety  Objective:    BP 138/78   Pulse 85   Ht 5\' 6"  (1.676 m)   Wt 175 lb (79.4 kg)   BMI 28.25 kg/m   Wt Readings from Last 3 Encounters:  12/17/16 175 lb (79.4 kg)  12/03/16 179 lb (81.2  kg)  04/18/16 162 lb (73.5 kg)    Physical Exam  Constitutional: Slightly over weight for height, not in acute distress, normal state of mind Eyes: PERRLA, EOMI, no exophthalmos ENT: moist mucous membranes, mild palpable thyromegaly, no cervical lymphadenopathy Cardiovascular: normal precordial activity, Regular Rate and Rhythm, no Murmur/Rubs/Gallops Respiratory:  adequate breathing efforts, no gross chest deformity, Clear to auscultation bilaterally Gastrointestinal: abdomen soft, Non -tender, No distension, Bowel Sounds  present Musculoskeletal: no gross deformities, strength intact in all four extremities Skin: moist, warm, no rashes Neurological:  + mild tremor with outstretched hands, Deep tendon reflexes normal in all four extremities.  Labs from 10/01/2016 showed TSH 1.6, total T4 9 0.2. - Review of her thyroid uptake and scan from 10/24/2016 showed 24-hour uptake of 22% with a single warm nodule within the left thyroid lobe.    - Dedicated thyroid ultrasound showed 2.3 cm nodule on the left lobe and 2 nodules on the isthmus (0.8 cm and 0.7 cm.)   Recent Results (from the past 2160 hour(s))  TSH     Status: None   Collection Time: 10/01/16 12:00 AM  Result Value Ref Range   TSH 1.60 0.41 - 5.90 uIU/mL    Comment: Total T4 9 point  TSH     Status: None   Collection Time: 12/03/16  3:36 PM  Result Value Ref Range   TSH 1.25 mIU/L    Comment:   Reference Range   > or = 20 Years  0.40-4.50   Pregnancy Range First trimester  0.26-2.66 Second trimester 0.55-2.73 Third trimester  0.43-2.91     T4, free     Status: None   Collection Time: 12/03/16  3:36 PM  Result Value Ref Range   Free T4 1.2 0.8 - 1.8 ng/dL  T3, free     Status: None   Collection Time: 12/03/16  3:36 PM  Result Value Ref Range   T3, Free 2.9 2.3 - 4.2 pg/mL  Thyroid peroxidase antibody     Status: None   Collection Time: 12/03/16  3:36 PM  Result Value Ref Range   Thyroperoxidase Ab SerPl-aCnc <1 <9 IU/mL  Thyroglobulin antibody     Status: None   Collection Time: 12/03/16  3:36 PM  Result Value Ref Range   Thyroglobulin Ab 1 <2 IU/mL     Assessment & Plan:   1. Toxic  thyroid nodule 2. Multinodular goiter - Her repeat thyroid function tests are within normal range. - Her thyroid ultrasound shows multiple nodules, largest on the left lobe , ill-defined, 2.3 cm meeting criteria for biopsy. - Even though this correlates to the general area of the warm nodule on the thyroid uptake and scan, I will proceed to  request fine needle aspiration of this nodule. - Next F will depend on the findings. She would not require antithyroid treatment at this time. -  She reports clinical improvement on PPI, I advised her to continue Protonix 40 mg by mouth daily. - I advised patient to maintain close follow up with Lemmie Evens, MD for primary care needs. Follow up plan: Return in about 3 weeks (around 01/07/2017) for follow up with biopsy results.  Glade Lloyd, MD Phone: [phone]  Fax: [phone]   12/17/2016, 4:03 PM

## 2016-12-30 ENCOUNTER — Other Ambulatory Visit: Payer: Self-pay | Admitting: "Endocrinology

## 2016-12-30 DIAGNOSIS — E049 Nontoxic goiter, unspecified: Secondary | ICD-10-CM

## 2017-01-08 ENCOUNTER — Ambulatory Visit (HOSPITAL_COMMUNITY): Admission: RE | Admit: 2017-01-08 | Payer: Medicare Other | Source: Ambulatory Visit

## 2017-01-09 ENCOUNTER — Inpatient Hospital Stay (HOSPITAL_COMMUNITY): Admission: RE | Admit: 2017-01-09 | Payer: Medicare Other | Source: Ambulatory Visit

## 2017-01-09 ENCOUNTER — Ambulatory Visit (HOSPITAL_COMMUNITY)
Admission: RE | Admit: 2017-01-09 | Discharge: 2017-01-09 | Disposition: A | Discharge status: Home / Self Care | Payer: Medicare Other | Source: Ambulatory Visit | Attending: "Endocrinology | Admitting: "Endocrinology

## 2017-01-09 DIAGNOSIS — D440 Neoplasm of uncertain behavior of thyroid gland: Secondary | ICD-10-CM | POA: Diagnosis not present

## 2017-01-09 DIAGNOSIS — E049 Nontoxic goiter, unspecified: Secondary | ICD-10-CM

## 2017-01-09 MED ORDER — LIDOCAINE HCL (PF) 2 % IJ SOLN
INTRAMUSCULAR | Status: AC
  Filled 2017-01-09: qty 10

## 2017-01-09 NOTE — Sedation Documentation (Signed)
Patient denies pain and is resting comfortably.  

## 2017-01-09 NOTE — Sedation Documentation (Signed)
3 samples taken without difficulty. Pt tolerated procedure with no difficulty.

## 2017-01-09 NOTE — Discharge Instructions (Signed)
Thyroid Biopsy °The thyroid gland is a butterfly-shaped gland located in the front of the neck. It produces hormones that affect metabolism, growth and development, and body temperature. Thyroid biopsy is a procedure in which small samples of tissue or fluid are removed from the thyroid gland. The samples are then looked at under a microscope to check for abnormalities. This procedure is done to determine the cause of thyroid problems. It may be done to check for infection, cancer, or other thyroid problems. °Two methods may be used for a thyroid biopsy. In one method, a thin needle is inserted through the skin and into the thyroid gland. In the other method, an open incision is made through the skin. °Tell a health care provider about: °· Any allergies you have. °· All medicines you are taking, including vitamins, herbs, eye drops, creams, and over-the-counter medicines. °· Any problems you or family members have had with anesthetic medicines. °· Any blood disorders you have. °· Any surgeries you have had. °· Any medical conditions you have. °What are the risks? °Generally, this is a safe procedure. However, problems can occur and include: °· Bleeding from the procedure site. °· Infection. °· Injury to structures near the thyroid gland. ° °What happens before the procedure? °· Ask your health care provider about: °? Changing or stopping your regular medicines. This is especially important if you are taking diabetes medicines or blood thinners. °? Taking medicines such as aspirin and ibuprofen. These medicines can thin your blood. Do not take these medicines before your procedure if your health care provider asks you not to. °· Do not eat or drink anything after midnight on the night before the procedure or as directed by your health care provider. °· You may have a blood sample taken. °What happens during the procedure? °Either of these methods may be used to perform a thyroid biopsy: °· Fine needle biopsy. You may  be given medicine to help you relax (sedative). You will be asked to lie on your back with your head tipped backward to extend your neck. An area on your neck will be cleaned. A needle will then be inserted through the skin of your neck. You may be asked to avoid coughing, talking, swallowing, or making sounds during some portions of the procedure. The needle will be withdrawn once the tissue or fluid samples have been removed. Pressure may be applied to your neck to reduce swelling and ensure that bleeding has stopped. The samples will be sent to a lab for examination. °· Open biopsy. You will be given medicine to make you sleep (general anesthetic). An incision will be made in your neck. A sample of thyroid tissue will be removed using surgical tools. The tissue sample will be sent for examination. In some cases, the sample may be examined during the biopsy. If that is done and cancer cells are found, some or all of the thyroid gland may be removed. The incision will be closed with stitches. ° °What happens after the procedure? °· Your recovery will be assessed and monitored. °· You may have soreness and tenderness at the site of the biopsy. This should go away after a few days. °· If you had an open biopsy, you may have a hoarse voice or sore throat for a couple days. °· It is your responsibility to get your test results. °This information is not intended to replace advice given to you by your health care provider. Make sure you discuss any questions you have with   your health care provider. °Document Released: 04/14/2007 Document Revised: 02/18/2016 Document Reviewed: 09/09/2013 °Elsevier Interactive Patient Education © 2018 [employer] ° °

## 2017-01-09 NOTE — Sedation Documentation (Signed)
ED Provider at bedside. 

## 2017-01-15 ENCOUNTER — Ambulatory Visit (INDEPENDENT_AMBULATORY_CARE_PROVIDER_SITE_OTHER): Payer: Medicare Other | Admitting: "Endocrinology

## 2017-01-15 ENCOUNTER — Encounter: Payer: Self-pay | Admitting: "Endocrinology

## 2017-01-15 VITALS — BP 147/73 | HR 80 | Ht 66.0 in | Wt 174.0 lb

## 2017-01-15 DIAGNOSIS — E049 Nontoxic goiter, unspecified: Secondary | ICD-10-CM | POA: Diagnosis not present

## 2017-01-15 NOTE — Progress Notes (Signed)
Subjective:    Patient ID: Joyce Martin, female    DOB: 03-17-1951, PCP Lemmie Evens, MD   Past Medical History:  Diagnosis Date  . Elevated BP 07/26/2014  . GERD (gastroesophageal reflux disease)   . Migraines   . 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 COLONOSCOPY N/A 04/18/2016   Procedure: COLONOSCOPY;  Surgeon: Danie Binder, MD;  Location: [facility];  Service: Endoscopy;  Laterality: N/A;  9:30 AM  . TUBAL LIGATION     Social History   Social History  . Marital status: Divorced    Spouse name: N/A  . Number of children: N/A  . Years of education: N/A   Social History Main Topics  . Smoking status: Former Smoker    Packs/day: 0.25    Years: 5.00    Types: Cigarettes  . Smokeless tobacco: Never Used  . Alcohol use No  . Drug use: No  . Sexual activity: Yes    Birth control/ protection: Post-menopausal   Other Topics Concern  . None   Social History Narrative  . None   Outpatient Encounter Prescriptions as of 01/15/2017  Medication Sig  . cromolyn (OPTICROM) 4 % ophthalmic solution Place 1 drop into both eyes every other day.  . ibuprofen (ADVIL,MOTRIN) 200 MG tablet Take 400 mg by mouth daily as needed for headache.   . pantoprazole (PROTONIX) 40 MG tablet Take 1 tablet (40 mg total) by mouth daily.   No facility-administered encounter medications on file as of 01/15/2017.    ALLERGIES: Allergies  Allergen Reactions  . Other     COMBID: for stomach     VACCINATION STATUS: Immunization History  Administered Date(s) Administered  . Tdap 02/28/2011    HPI  66 year old female patient with medical history as above. She is being seen in  Follow-up for toxic nodule on the left lower her thyroid . -She denies any prior history of thyroid dysfunction. She is not currently on thyroid hormone supplements nor antithyroid therapy. She denies family history of thyroid  dysfunction. She was found to have warm nodule was 22% uptake on nuclear medicine test on her thyroid on 10/24/2016. - Heart thyroid function tests from 10/01/2016 showed TSH 1.6, total T4 9 .2. - She has vaguely localized pain on the right side of her neck anteriorly.  - She denies dysphagia, shortness of breath, voice change. - She has fluctuating body weight however gained 15 pounds over the last year.  - She denies palpitations, heat/cold intolerance, and tremors.  Review of Systems  Constitutional: + weight loss, no fatigue, no subjective hyperthermia, no subjective hypothermia Eyes: no blurry vision, no xerophthalmia ENT: no sore throat, no nodules palpated in throat, no dysphagia/odynophagia, no hoarseness Cardiovascular: no Chest Pain, no Shortness of Breath, no palpitations, no leg swelling Respiratory: no cough, no SOB Gastrointestinal: no Nausea/Vomiting/Diarhhea Musculoskeletal: no muscle/joint aches Skin: no rashes Neurological: no tremors, no numbness, no tingling, no dizziness Psychiatric: no depression, no anxiety  Objective:    BP (!) 147/73   Pulse 80   Ht 5\' 6"  (1.676 m)   Wt 174 lb (78.9 kg)   BMI 28.08 kg/m   Wt Readings from Last 3 Encounters:  01/15/17 174 lb (78.9 kg)  12/17/16 175 lb (79.4 kg)  12/03/16 179 lb (81.2 kg)    Physical Exam  Constitutional: Slightly over weight for height, not in acute distress, normal  state of mind Eyes: PERRLA, EOMI, no exophthalmos ENT: moist mucous membranes, mild palpable thyromegaly, no cervical lymphadenopathy Cardiovascular: normal precordial activity, Regular Rate and Rhythm, no Murmur/Rubs/Gallops Respiratory:  adequate breathing efforts, no gross chest deformity, Clear to auscultation bilaterally Gastrointestinal: abdomen soft, Non -tender, No distension, Bowel Sounds present Musculoskeletal: no gross deformities, strength intact in all four extremities Skin: moist, warm, no rashes Neurological:  + mild  tremor with outstretched hands, Deep tendon reflexes normal in all four extremities.  Labs from 10/01/2016 showed TSH 1.6, total T4 9 0.2. - Review of her thyroid uptake and scan from 10/24/2016 showed 24-hour uptake of 22% with a single warm nodule within the left thyroid lobe.    - Dedicated thyroid ultrasound showed 2.3 cm nodule on the left lobe and 2 nodules on the isthmus (0.8 cm and 0.7 cm.)   Fine needle aspiration psychologic Diagnosis on 01/09/2017 showed: THYROID, FINE NEEDLE ASPIRATION, LEFT (SPECIMEN 1 OF 1 COLLECTED 71/12/18) ATYPIA OF UNDETERMINED SIGNIFICANCE OR FOLLICULAR LESION OF UNDETERMINED SIGNIFICANCE (BETHESDA CATEGORY III).  Recent Results (from the past 2160 hour(s))  TSH     Status: None   Collection Time: 12/03/16  3:36 PM  Result Value Ref Range   TSH 1.25 mIU/L    Comment:   Reference Range   > or = 20 Years  0.40-4.50   Pregnancy Range First trimester  0.26-2.66 Second trimester 0.55-2.73 Third trimester  0.43-2.91     T4, free     Status: None   Collection Time: 12/03/16  3:36 PM  Result Value Ref Range   Free T4 1.2 0.8 - 1.8 ng/dL  T3, free     Status: None   Collection Time: 12/03/16  3:36 PM  Result Value Ref Range   T3, Free 2.9 2.3 - 4.2 pg/mL  Thyroid peroxidase antibody     Status: None   Collection Time: 12/03/16  3:36 PM  Result Value Ref Range   Thyroperoxidase Ab SerPl-aCnc <1 <9 IU/mL  Thyroglobulin antibody     Status: None   Collection Time: 12/03/16  3:36 PM  Result Value Ref Range   Thyroglobulin Ab 1 <2 IU/mL     Assessment & Plan:   1. Toxic  thyroid nodule 2. Multinodular goiter - Her repeat thyroid function tests are within normal range. - Her thyroid ultrasound shows multiple nodules, largest on the left lobe , ill-defined, 2.3 cm meeting criteria for biopsy. - Subsequent FNA confirms atypia of undetermined significance or follicular lesion of undetermined significance ( Bethesda category III); - This carries  up to 15% risk of malignancy. I discussed this finding with patient as well as her options including observation or symptoms thyroidectomy. - Patient agrees with my suggestion of symptoms thyroidectomy with subsequent thyroid hormone replacement for life.  - I will refer her to Dr. Arnoldo Morale for thyroidectomy and she will return in 8 weeks (after her surgery) with repeat labs for TSH/FT4. -  She reports clinical improvement on PPI, I advised her to continue Protonix 40 mg by mouth daily. - I advised patient to maintain close follow up with Lemmie Evens, MD for primary care needs. Follow up plan: Return in about 8 weeks (around 03/12/2017) for follow up with labs after surgery.  Glade Lloyd, MD Phone: [phone]  Fax: [phone]   01/15/2017, 4:44 PM

## 2017-01-16 ENCOUNTER — Ambulatory Visit (INDEPENDENT_AMBULATORY_CARE_PROVIDER_SITE_OTHER): Payer: Medicare Other | Admitting: Otolaryngology

## 2017-01-16 DIAGNOSIS — K219 Gastro-esophageal reflux disease without esophagitis: Secondary | ICD-10-CM | POA: Diagnosis not present

## 2017-01-16 DIAGNOSIS — R070 Pain in throat: Secondary | ICD-10-CM

## 2017-01-28 ENCOUNTER — Ambulatory Visit (INDEPENDENT_AMBULATORY_CARE_PROVIDER_SITE_OTHER): Payer: Medicare Other | Admitting: General Surgery

## 2017-01-28 ENCOUNTER — Encounter: Payer: Self-pay | Admitting: General Surgery

## 2017-01-28 VITALS — BP 176/102 | HR 120 | Temp 98.0°F | Resp 18 | Ht 68.0 in | Wt 178.0 lb

## 2017-01-28 DIAGNOSIS — E041 Nontoxic single thyroid nodule: Secondary | ICD-10-CM

## 2017-01-28 NOTE — Progress Notes (Signed)
Joyce Martin; [id number]; [birth year]   HPI   Patient is a 66 year old white female who was referred to my care by Dr. Dorris Fetch for evaluation treatment of an abnormal fine-needle aspiration of the left thyroid nodule.  Patient has a history of a goiter with hyperfunctioning nodule on the left side in the past.  She is currently euthyroid.  Fine-needle aspiration revealed atypical cells.  Patient states that she feels her parathyroid gland swelling at times on the left side.  She states she does have some dysphagia during those episodes.  She denies any fever, chills, heat intolerance, family history of thyroid cancer, radiation history.  She states her weight does vary.  Currently having no pain. Past Medical History:  Diagnosis Date  . Elevated BP 07/26/2014  . GERD (gastroesophageal reflux disease)   . Migraines   . 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 COLONOSCOPY N/A 04/18/2016   Procedure: COLONOSCOPY;  Surgeon: Danie Binder, MD;  Location: [facility];  Service: Endoscopy;  Laterality: N/A;  9:30 AM  . TUBAL LIGATION      Family History  Problem Relation Age of Onset  . Diabetes Mother   . Hypertension Mother   . Heart disease Father        heart attack  . Cancer Sister        gallbladder  . Diabetes Brother   . Diabetes Daughter        borderline  . Diabetes Maternal Grandmother   . Diabetes Paternal Grandmother   . Diabetes Paternal Grandfather   . COPD Brother   . Hypertension Brother   . Parkinson's disease Brother   . Cancer Brother   . Colon cancer Neg Hx     Current Outpatient Prescriptions on File Prior to Visit  Medication Sig Dispense Refill  . cromolyn (OPTICROM) 4 % ophthalmic solution Place 1 drop into both eyes every other day.    . ibuprofen (ADVIL,MOTRIN) 200 MG tablet Take 400 mg by mouth daily as needed for headache.     . pantoprazole (PROTONIX) 40 MG tablet Take 1  tablet (40 mg total) by mouth daily. 30 tablet 1   No current facility-administered medications on file prior to visit.     Allergies  Allergen Reactions  . Other     COMBID: for stomach     History  Alcohol Use No    History  Smoking Status  . Former Smoker  . Packs/day: 0.25  . Years: 5.00  . Types: Cigarettes  Smokeless Tobacco  . Never Used    Review of Systems  Constitutional: Negative.   HENT: Positive for sinus pain.   Eyes: Negative.   Respiratory: Negative.   Cardiovascular: Negative.   Gastrointestinal: Positive for heartburn.  Genitourinary: Negative.   Musculoskeletal: Positive for neck pain.  Skin: Negative.   Neurological: Positive for headaches.  Endo/Heme/Allergies: Negative.   Psychiatric/Behavioral: Negative.     Objective   Vitals:   01/28/17 1358  BP: (!) 176/102  Pulse: (!) 120  Resp: 18  Temp: 98 F (36.7 C)    Physical Exam  Constitutional: She is oriented to person, place, and time and well-developed, well-nourished, and in no distress.  HENT:  Head: Normocephalic and atraumatic.  Neck: Normal range of motion. Neck supple. No tracheal deviation present. No thyromegaly present.  Minimal nodularity noted in the left lobe of thyroid gland.  Cardiovascular:  Normal rate, regular rhythm and normal heart sounds.   No murmur heard. Pulmonary/Chest: Effort normal and breath sounds normal. She has no wheezes. She has no rales.  Lymphadenopathy:    She has no cervical adenopathy.  Neurological: She is alert and oriented to person, place, and time.  Skin: Skin is warm and dry.  Vitals reviewed.    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  The risks and benefits of the procedure including bleeding, infection, nerve injury, and the possibility of finding malignancy were fully explained to the patient, who gave  informed consent.  She does realize that she will be on Synthroid for the rest of her life.

## 2017-01-28 NOTE — H&P (Signed)
Joyce Martin; [id number]; [birth year]   HPI   Patient is a 66 year old white female who was referred to my care by Dr. Dorris Fetch for evaluation treatment of an abnormal fine-needle aspiration of the left thyroid nodule.  Patient has a history of a goiter with hyperfunctioning nodule on the left side in the past.  She is currently euthyroid.  Fine-needle aspiration revealed atypical cells.  Patient states that she feels her parathyroid gland swelling at times on the left side.  She states she does have some dysphagia during those episodes.  She denies any fever, chills, heat intolerance, family history of thyroid cancer, radiation history.  She states her weight does vary.  Currently having no pain. Past Medical History:  Diagnosis Date  . Elevated BP 07/26/2014  . GERD (gastroesophageal reflux disease)   . Migraines   . 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 COLONOSCOPY N/A 04/18/2016   Procedure: COLONOSCOPY;  Surgeon: Danie Binder, MD;  Location: [facility];  Service: Endoscopy;  Laterality: N/A;  9:30 AM  . TUBAL LIGATION      Family History  Problem Relation Age of Onset  . Diabetes Mother   . Hypertension Mother   . Heart disease Father        heart attack  . Cancer Sister        gallbladder  . Diabetes Brother   . Diabetes Daughter        borderline  . Diabetes Maternal Grandmother   . Diabetes Paternal Grandmother   . Diabetes Paternal Grandfather   . COPD Brother   . Hypertension Brother   . Parkinson's disease Brother   . Cancer Brother   . Colon cancer Neg Hx     Current Outpatient Prescriptions on File Prior to Visit  Medication Sig Dispense Refill  . cromolyn (OPTICROM) 4 % ophthalmic solution Place 1 drop into both eyes every other day.    . ibuprofen (ADVIL,MOTRIN) 200 MG tablet Take 400 mg by mouth daily as needed for headache.     . pantoprazole (PROTONIX) 40 MG tablet Take 1  tablet (40 mg total) by mouth daily. 30 tablet 1   No current facility-administered medications on file prior to visit.     Allergies  Allergen Reactions  . Other     COMBID: for stomach     History  Alcohol Use No    History  Smoking Status  . Former Smoker  . Packs/day: 0.25  . Years: 5.00  . Types: Cigarettes  Smokeless Tobacco  . Never Used    Review of Systems  Constitutional: Negative.   HENT: Positive for sinus pain.   Eyes: Negative.   Respiratory: Negative.   Cardiovascular: Negative.   Gastrointestinal: Positive for heartburn.  Genitourinary: Negative.   Musculoskeletal: Positive for neck pain.  Skin: Negative.   Neurological: Positive for headaches.  Endo/Heme/Allergies: Negative.   Psychiatric/Behavioral: Negative.     Objective   Vitals:   01/28/17 1358  BP: (!) 176/102  Pulse: (!) 120  Resp: 18  Temp: 98 F (36.7 C)    Physical Exam  Constitutional: She is oriented to person, place, and time and well-developed, well-nourished, and in no distress.  HENT:  Head: Normocephalic and atraumatic.  Neck: Normal range of motion. Neck supple. No tracheal deviation present. No thyromegaly present.  Minimal nodularity noted in the left lobe of thyroid gland.  Cardiovascular:  Normal rate, regular rhythm and normal heart sounds.   No murmur heard. Pulmonary/Chest: Effort normal and breath sounds normal. She has no wheezes. She has no rales.  Lymphadenopathy:    She has no cervical adenopathy.  Neurological: She is alert and oriented to person, place, and time.  Skin: Skin is warm and dry.  Vitals reviewed.    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  The risks and benefits of the procedure including bleeding, infection, nerve injury, and the possibility of finding malignancy were fully explained to the patient, who gave  informed consent.  She does realize that she will be on Synthroid for the rest of her life.

## 2017-01-28 NOTE — Patient Instructions (Signed)
Thyroidectomy A thyroidectomy is a surgery that is done to remove all (total thyroidectomy) or part (subtotal thyroidectomy) of your thyroid gland. The thyroid is a butterfly-shaped gland that is located at the lower front of your neck. It produces a substance that helps to control certain body processes (thyroid hormone). The amount of thyroid gland tissue that is removed during your thyroidectomy depends on the reason you need the procedure. You may have a thyroidectomy to treat conditions including:  Thyroid nodules.  Thyroid cancer.  Benign thyroid tumors.  Goiter.  Overactive thyroid gland (hyperthyroidism).  There are different ways to do a thyroidectomy:  Conventional thyroidectomy (open thyroidectomy). This procedure is the most common. In this procedure, the thyroid gland is removed through one surgical cut (incision) in the neck.  Endoscopic thyroidectomy. This procedure is less invasive. In this procedure, there may be several smaller incisions in the neck, chest, or armpit. The surgeon uses a tiny camera and other assistive tools to remove the thyroid gland.  Tell a health care provider about:  Any allergies you have.  All medicines you are taking, including vitamins, herbs, eye drops, creams, and over-the-counter medicines.  Any problems you or family members have had with anesthetic medicines.  Any blood disorders you have.  Any surgeries you have had.  Any medical conditions you have. What are the risks? Generally, this is a safe procedure. However, problems can occur and include:  A decrease in parathyroid hormone levels (hypoparathyroidism). Your parathyroid glands are located behind your thyroid gland, and they maintain the calcium level in your body. If these glands are damaged during surgery, your calcium level will drop. This will make your nerves irritable and cause muscle spasms.  An increase in thyroid hormone.  Damage to the nerves of your voice box  (larynx).  Bleeding.  Infection at the site of the incision or incisions.  Temporary breathing difficulties. This is a very rare complication. It usually goes away within weeks.  What happens before the procedure?  Your health care provider will perform a physical exam and assess your voice for vocal changes.  Ask your health care provider about: ? Changing or stopping your regular medicines. This is especially important if you are taking diabetes medicines or blood thinners. ? Taking medicines such as aspirin and ibuprofen. These medicines can thin your blood. Do not take these medicines before your procedure if your health care provider instructs you not to.  Follow instructions from your health care provider about eating or drinking restrictions. What happens during the procedure? You will be given a medicine that makes you go to sleep (general anesthetic). Depending on which type of thyroidectomy you have, this is what may happen during the procedure: Conventional Thyroidectomy  The surgeon will make an incision in the center of your lower neck.  The muscles in your neck will be separated to reveal your thyroid gland.  Part or all of your thyroid gland will be removed.  You may need a tube (catheter) at the incision site to drain blood and fluids that accumulate under the skin after the procedure.The catheter may have to stay in place for a day or two after the procedure.  The incision will be closed with stitches (sutures). Endoscopic Thyroidectomy  The surgeon will make several small incisions in your neck, chest, or armpit.  The surgeon will use a narrow tube with a light and camera at the end (endoscope). The surgeon will insert the endoscope into an incision.  Part or   all of your thyroid gland will be removed.  You may need a catheter at the incision site to drain blood and fluids that accumulate under the skin after the procedure.The catheter may have to stay in  place for a day or two after the procedure.  The incision will be closed with sutures. What happens after the procedure?  Your blood pressure, heart rate, breathing rate, and blood oxygen level will be monitored often until the medicines you were given have worn off.  Depending on the type of thyroidectomy you had, you may have: ? A swollen neck. ? Some mild neck pain. ? A slightly sore throat. ? A weak voice.  You will not be able to eat or drink until your health care provider says it is okay.  You may have a blood test to check the level of calcium in your body.  If you had a catheter put in during the procedure, it will usually be removed the next day. This information is not intended to replace advice given to you by your health care provider. Make sure you discuss any questions you have with your health care provider. Document Released: 12/11/2000 Document Revised: 02/18/2016 Document Reviewed: 11/17/2013 [employer] Interactive Patient Education  2018 [employer]  

## 2017-01-31 NOTE — Patient Instructions (Signed)
Joyce Martin  01/31/2017     @PREFPERIOPPHARMACY @   Your procedure is scheduled on  02/10/2017   Report to [facility] at  615  A.M.  Call this number if you have problems the morning of surgery:  [phone]   Remember:  Do not eat food or drink liquids after midnight.  Take these medicines the morning of surgery with A SIP OF WATER  protonix.   Do not wear jewelry, make-up or nail polish.  Do not wear lotions, powders, or perfumes, or deoderant.  Do not shave 48 hours prior to surgery.  Men may shave face and neck.  Do not bring valuables to the hospital.  [facility] is not responsible for any belongings or valuables.  Contacts, dentures or bridgework may not be worn into surgery.  Leave your suitcase in the car.  After surgery it may be brought to your room.  For patients admitted to the hospital, discharge time will be determined by your treatment team.  Patients discharged the day of surgery will not be allowed to drive home.   Name and phone number of your driver:   family Special instructions:  None  Please read over the following fact sheets that you were given. Anesthesia Post-op Instructions and Care and Recovery After Surgery       Thyroidectomy A thyroidectomy is a surgery that is done to remove all (total thyroidectomy) or part (subtotal thyroidectomy) of your thyroid gland. The thyroid is a butterfly-shaped gland that is located at the lower front of your neck. It produces a substance that helps to control certain body processes (thyroid hormone). The amount of thyroid gland tissue that is removed during your thyroidectomy depends on the reason you need the procedure. You may have a thyroidectomy to treat conditions including:  Thyroid nodules.  Thyroid cancer.  Benign thyroid tumors.  Goiter.  Overactive thyroid gland (hyperthyroidism).  There are different ways to do a thyroidectomy:  Conventional thyroidectomy (open  thyroidectomy). This procedure is the most common. In this procedure, the thyroid gland is removed through one surgical cut (incision) in the neck.  Endoscopic thyroidectomy. This procedure is less invasive. In this procedure, there may be several smaller incisions in the neck, chest, or armpit. The surgeon uses a tiny camera and other assistive tools to remove the thyroid gland.  Tell a health care provider about:  Any allergies you have.  All medicines you are taking, including vitamins, herbs, eye drops, creams, and over-the-counter medicines.  Any problems you or family members have had with anesthetic medicines.  Any blood disorders you have.  Any surgeries you have had.  Any medical conditions you have. What are the risks? Generally, this is a safe procedure. However, problems can occur and include:  A decrease in parathyroid hormone levels (hypoparathyroidism). Your parathyroid glands are located behind your thyroid gland, and they maintain the calcium level in your body. If these glands are damaged during surgery, your calcium level will drop. This will make your nerves irritable and cause muscle spasms.  An increase in thyroid hormone.  Damage to the nerves of your voice box (larynx).  Bleeding.  Infection at the site of the incision or incisions.  Temporary breathing difficulties. This is a very rare complication. It usually goes away within weeks.  What happens before the procedure?  Your health care provider will perform a physical exam and assess your voice for vocal changes.  Ask  your health care provider about: ? Changing or stopping your regular medicines. This is especially important if you are taking diabetes medicines or blood thinners. ? Taking medicines such as aspirin and ibuprofen. These medicines can thin your blood. Do not take these medicines before your procedure if your health care provider instructs you not to.  Follow instructions from your health  care provider about eating or drinking restrictions. What happens during the procedure? You will be given a medicine that makes you go to sleep (general anesthetic). Depending on which type of thyroidectomy you have, this is what may happen during the procedure: Conventional Thyroidectomy  The surgeon will make an incision in the center of your lower neck.  The muscles in your neck will be separated to reveal your thyroid gland.  Part or all of your thyroid gland will be removed.  You may need a tube (catheter) at the incision site to drain blood and fluids that accumulate under the skin after the procedure.The catheter may have to stay in place for a day or two after the procedure.  The incision will be closed with stitches (sutures). Endoscopic Thyroidectomy  The surgeon will make several small incisions in your neck, chest, or armpit.  The surgeon will use a narrow tube with a light and camera at the end (endoscope). The surgeon will insert the endoscope into an incision.  Part or all of your thyroid gland will be removed.  You may need a catheter at the incision site to drain blood and fluids that accumulate under the skin after the procedure.The catheter may have to stay in place for a day or two after the procedure.  The incision will be closed with sutures. What happens after the procedure?  Your blood pressure, heart rate, breathing rate, and blood oxygen level will be monitored often until the medicines you were given have worn off.  Depending on the type of thyroidectomy you had, you may have: ? A swollen neck. ? Some mild neck pain. ? A slightly sore throat. ? A weak voice.  You will not be able to eat or drink until your health care provider says it is okay.  You may have a blood test to check the level of calcium in your body.  If you had a catheter put in during the procedure, it will usually be removed the next day. This information is not intended to replace  advice given to you by your health care provider. Make sure you discuss any questions you have with your health care provider. Document Released: 12/11/2000 Document Revised: 02/18/2016 Document Reviewed: 11/17/2013 Elsevier Interactive Patient Education  2018 [place]. Thyroidectomy, Care After Refer to this sheet in the next few weeks. These instructions provide you with information about caring for yourself after your procedure. Your health care provider may also give you more specific instructions. Your treatment has been planned according to current medical practices, but problems sometimes occur. Call your health care provider if you have any problems or questions after your procedure. What can I expect after the procedure? After your procedure, it is typical to have:  Mild pain in the neck or upper body, especially when swallowing.  A sore throat.  A weak voice.  Follow these instructions at home:  Take medicines only as directed by your health care provider.  If your entire thyroid gland was removed, you may need to take thyroid hormone medicine from now on.  Do not take medicines that contain aspirin and ibuprofen  until your health care provider says that you can. These medicines can increase your risk of bleeding.  Some pain medicines cause constipation. Drink enough fluid to keep your urine clear or pale yellow. This can help to prevent constipation.  Start slowly with eating. You may need to have only liquids and soft foods for a few days or as directed by your health care provider.  Do not take baths, swim, or use a hot tub until your health care provider approves.  There are many different ways to close and cover an incision, including stitches (sutures), skin glue, and adhesive strips. Follow your health care provider's instructions for: ? Incision care. ? Bandage (dressing) changes and removal. ? Incision closure removal.  Resume your usual activities as directed  by your health care provider.  For the first 10 days after the procedure or as instructed by your health care provider: ? Do not lift anything heavier than 20 lb (9.1 kg). ? Do not jog, swim, or do other strenuous exercises. ? Do not play contact sports.  Keep all follow-up visits as directed by your health care provider. This is important. Contact a health care provider if:  The soreness in your throat gets worse.  You have increased pain at your incision or incisions.  You have increased bleeding from an incision.  Your incision becomes infected. Watch for: ? Swelling. ? Redness. ? Warmth. ? Pus.  You notice a bad smell coming from an incision or dressing.  You have a fever.  You feel lightheaded or faint.  You have numbness, tingling, or muscle spasms in your: ? Arms. ? Hands. ? Feet. ? Face.  You have trouble swallowing. Get help right away if:  You develop a rash.  You have difficulty breathing.  You hear whistling noises coming from your chest.  You develop a cough that gets worse.  Your speech changes, or you have hoarseness that gets worse. This information is not intended to replace advice given to you by your health care provider. Make sure you discuss any questions you have with your health care provider. Document Released: 01/04/2005 Document Revised: 02/18/2016 Document Reviewed: 11/17/2013 Elsevier Interactive Patient Education  2018 [place] Anesthesia, Adult General anesthesia is the use of medicines to make a person "go to sleep" (be unconscious) for a medical procedure. General anesthesia is often recommended when a procedure:  Is long.  Requires you to be still or in an unusual position.  Is major and can cause you to lose blood.  Is impossible to do without general anesthesia.  The medicines used for general anesthesia are called general anesthetics. In addition to making you sleep, the medicines:  Prevent  pain.  Control your blood pressure.  Relax your muscles.  Tell a health care provider about:  Any allergies you have.  All medicines you are taking, including vitamins, herbs, eye drops, creams, and over-the-counter medicines.  Any problems you or family members have had with anesthetic medicines.  Types of anesthetics you have had in the past.  Any bleeding disorders you have.  Any surgeries you have had.  Any medical conditions you have.  Any history of heart or lung conditions, such as heart failure, sleep apnea, or chronic obstructive pulmonary disease (COPD).  Whether you are pregnant or may be pregnant.  Whether you use tobacco, alcohol, marijuana, or street drugs.  Any history of Armed forces logistics/support/administrative officer.  Any history of depression or anxiety. What are the risks? Generally, this is  a safe procedure. However, problems may occur, including:  Allergic reaction to anesthetics.  Lung and heart problems.  Inhaling food or liquids from your stomach into your lungs (aspiration).  Injury to nerves.  Waking up during your procedure and being unable to move (rare).  Extreme agitation or a state of mental confusion (delirium) when you wake up from the anesthetic.  Air in the bloodstream, which can lead to stroke.  These problems are more likely to develop if you are having a major surgery or if you have an advanced medical condition. You can prevent some of these complications by answering all of your health care provider's questions thoroughly and by following all pre-procedure instructions. General anesthesia can cause side effects, including:  Nausea or vomiting  A sore throat from the breathing tube.  Feeling cold or shivery.  Feeling tired, washed out, or achy.  Sleepiness or drowsiness.  Confusion or agitation.  What happens before the procedure? Staying hydrated Follow instructions from your health care provider about hydration, which may include:  Up to 2  hours before the procedure - you may continue to drink clear liquids, such as water, clear fruit juice, black coffee, and plain tea.  Eating and drinking restrictions Follow instructions from your health care provider about eating and drinking, which may include:  8 hours before the procedure - stop eating heavy meals or foods such as meat, fried foods, or fatty foods.  6 hours before the procedure - stop eating light meals or foods, such as toast or cereal.  6 hours before the procedure - stop drinking milk or drinks that contain milk.  2 hours before the procedure - stop drinking clear liquids.  Medicines  Ask your health care provider about: ? Changing or stopping your regular medicines. This is especially important if you are taking diabetes medicines or blood thinners. ? Taking medicines such as aspirin and ibuprofen. These medicines can thin your blood. Do not take these medicines before your procedure if your health care provider instructs you not to. ? Taking new dietary supplements or medicines. Do not take these during the week before your procedure unless your health care provider approves them.  If you are told to take a medicine or to continue taking a medicine on the day of the procedure, take the medicine with sips of water. General instructions   Ask if you will be going home the same day, the following day, or after a longer hospital stay. ? Plan to have someone take you home. ? Plan to have someone stay with you for the first 24 hours after you leave the hospital or clinic.  For 3-6 weeks before the procedure, try not to use any tobacco products, such as cigarettes, chewing tobacco, and e-cigarettes.  You may brush your teeth on the morning of the procedure, but make sure to spit out the toothpaste. What happens during the procedure?  You will be given anesthetics through a mask and through an IV tube in one of your veins.  You may receive medicine to help you  relax (sedative).  As soon as you are asleep, a breathing tube may be used to help you breathe.  An anesthesia specialist will stay with you throughout the procedure. He or she will help keep you comfortable and safe by continuing to give you medicines and adjusting the amount of medicine that you get. He or she will also watch your blood pressure, pulse, and oxygen levels to make sure that the  anesthetics do not cause any problems.  If a breathing tube was used to help you breathe, it will be removed before you wake up. The procedure may vary among health care providers and hospitals. What happens after the procedure?  You will wake up, often slowly, after the procedure is complete, usually in a recovery area.  Your blood pressure, heart rate, breathing rate, and blood oxygen level will be monitored until the medicines you were given have worn off.  You may be given medicine to help you calm down if you feel anxious or agitated.  If you will be going home the same day, your health care provider may check to make sure you can stand, drink, and urinate.  Your health care providers will treat your pain and side effects before you go home.  Do not drive for 24 hours if you received a sedative.  You may: ? Feel nauseous and vomit. ? Have a sore throat. ? Have mental slowness. ? Feel cold or shivery. ? Feel sleepy. ? Feel tired. ? Feel sore or achy, even in parts of your body where you did not have surgery. This information is not intended to replace advice given to you by your health care provider. Make sure you discuss any questions you have with your health care provider. Document Released: 09/24/2007 Document Revised: 11/28/2015 Document Reviewed: 06/01/2015 Elsevier Interactive Patient Education  2018 [place] Anesthesia, Adult, Care After These instructions provide you with information about caring for yourself after your procedure. Your health care provider may also  give you more specific instructions. Your treatment has been planned according to current medical practices, but problems sometimes occur. Call your health care provider if you have any problems or questions after your procedure. What can I expect after the procedure? After the procedure, it is common to have:  Vomiting.  A sore throat.  Mental slowness.  It is common to feel:  Nauseous.  Cold or shivery.  Sleepy.  Tired.  Sore or achy, even in parts of your body where you did not have surgery.  Follow these instructions at home: For at least 24 hours after the procedure:  Do not: ? Participate in activities where you could fall or become injured. ? Drive. ? Use heavy machinery. ? Drink alcohol. ? Take sleeping pills or medicines that cause drowsiness. ? Make important decisions or sign legal documents. ? Take care of children on your own.  Rest. Eating and drinking  If you vomit, drink water, juice, or soup when you can drink without vomiting.  Drink enough fluid to keep your urine clear or pale yellow.  Make sure you have little or no nausea before eating solid foods.  Follow the diet recommended by your health care provider. General instructions  Have a responsible adult stay with you until you are awake and alert.  Return to your normal activities as told by your health care provider. Ask your health care provider what activities are safe for you.  Take over-the-counter and prescription medicines only as told by your health care provider.  If you smoke, do not smoke without supervision.  Keep all follow-up visits as told by your health care provider. This is important. Contact a health care provider if:  You continue to have nausea or vomiting at home, and medicines are not helpful.  You cannot drink fluids or start eating again.  You cannot urinate after 8-12 hours.  You develop a skin rash.  You have fever.  You have increasing redness at the  site of your procedure. Get help right away if:  You have difficulty breathing.  You have chest pain.  You have unexpected bleeding.  You feel that you are having a life-threatening or urgent problem. This information is not intended to replace advice given to you by your health care provider. Make sure you discuss any questions you have with your health care provider. Document Released: 09/23/2000 Document Revised: 11/20/2015 Document Reviewed: 06/01/2015 Elsevier Interactive Patient Education  [employer].

## 2017-02-05 ENCOUNTER — Encounter (HOSPITAL_COMMUNITY)
Admission: RE | Admit: 2017-02-05 | Discharge: 2017-02-05 | Disposition: A | Discharge status: Home / Self Care | Payer: Medicare Other | Source: Ambulatory Visit | Attending: General Surgery | Admitting: General Surgery

## 2017-02-05 ENCOUNTER — Other Ambulatory Visit: Payer: Self-pay

## 2017-02-05 DIAGNOSIS — C73 Malignant neoplasm of thyroid gland: Secondary | ICD-10-CM | POA: Insufficient documentation

## 2017-02-05 LAB — CBC WITH DIFFERENTIAL/PLATELET
BASOS ABS: 0 10*3/uL (ref 0.0–0.1)
BASOS PCT: 1 %
Eosinophils Absolute: 0.1 10*3/uL (ref 0.0–0.7)
Eosinophils Relative: 1 %
HEMATOCRIT: 35 % — AB (ref 36.0–46.0)
HEMOGLOBIN: 11.8 g/dL — AB (ref 12.0–15.0)
LYMPHS PCT: 39 %
Lymphs Abs: 2.2 10*3/uL (ref 0.7–4.0)
MCH: 28.6 pg (ref 26.0–34.0)
MCHC: 33.7 g/dL (ref 30.0–36.0)
MCV: 84.7 fL (ref 78.0–100.0)
Monocytes Absolute: 0.3 10*3/uL (ref 0.1–1.0)
Monocytes Relative: 6 %
NEUTROS ABS: 3 10*3/uL (ref 1.7–7.7)
NEUTROS PCT: 53 %
Platelets: 276 10*3/uL (ref 150–400)
RBC: 4.13 MIL/uL (ref 3.87–5.11)
RDW: 13.7 % (ref 11.5–15.5)
WBC: 5.6 10*3/uL (ref 4.0–10.5)

## 2017-02-05 LAB — COMPREHENSIVE METABOLIC PANEL
ALBUMIN: 4.1 g/dL (ref 3.5–5.0)
ALK PHOS: 64 U/L (ref 38–126)
ALT: 20 U/L (ref 14–54)
AST: 24 U/L (ref 15–41)
Anion gap: 7 (ref 5–15)
BILIRUBIN TOTAL: 0.7 mg/dL (ref 0.3–1.2)
BUN: 14 mg/dL (ref 6–20)
CO2: 28 mmol/L (ref 22–32)
Calcium: 9.7 mg/dL (ref 8.9–10.3)
Chloride: 108 mmol/L (ref 101–111)
Creatinine, Ser: 1.08 mg/dL — ABNORMAL HIGH (ref 0.44–1.00)
GFR calc Af Amer: >60 mL/min (ref >60)
GFR calc non Af Amer: 52 mL/min — ABNORMAL LOW (ref >60)
GLUCOSE: 109 mg/dL — AB (ref 65–99)
Potassium: 3.9 mmol/L (ref 3.5–5.1)
Sodium: 143 mmol/L (ref 135–145)
Total Protein: 7.6 g/dL (ref 6.5–8.1)

## 2017-02-06 ENCOUNTER — Encounter (HOSPITAL_COMMUNITY): Payer: Self-pay

## 2017-02-10 ENCOUNTER — Encounter (HOSPITAL_COMMUNITY): Payer: Self-pay

## 2017-02-10 ENCOUNTER — Ambulatory Visit (HOSPITAL_COMMUNITY): Payer: Medicare Other | Admitting: Anesthesiology

## 2017-02-10 ENCOUNTER — Observation Stay (HOSPITAL_COMMUNITY)
Admission: RE | Admit: 2017-02-10 | Discharge: 2017-02-11 | Disposition: A | Discharge status: Home / Self Care | Payer: Medicare Other | Source: Ambulatory Visit | Attending: General Surgery | Admitting: General Surgery

## 2017-02-10 ENCOUNTER — Encounter (HOSPITAL_COMMUNITY)
Admission: RE | Disposition: A | Discharge status: Home / Self Care | Payer: Self-pay | Source: Ambulatory Visit | Attending: General Surgery

## 2017-02-10 DIAGNOSIS — K219 Gastro-esophageal reflux disease without esophagitis: Secondary | ICD-10-CM | POA: Insufficient documentation

## 2017-02-10 DIAGNOSIS — E890 Postprocedural hypothyroidism: Secondary | ICD-10-CM

## 2017-02-10 DIAGNOSIS — E041 Nontoxic single thyroid nodule: Secondary | ICD-10-CM

## 2017-02-10 DIAGNOSIS — Z87891 Personal history of nicotine dependence: Secondary | ICD-10-CM | POA: Diagnosis not present

## 2017-02-10 DIAGNOSIS — Z79899 Other long term (current) drug therapy: Secondary | ICD-10-CM | POA: Insufficient documentation

## 2017-02-10 DIAGNOSIS — C73 Malignant neoplasm of thyroid gland: Secondary | ICD-10-CM | POA: Diagnosis not present

## 2017-02-10 DIAGNOSIS — Z9889 Other specified postprocedural states: Secondary | ICD-10-CM

## 2017-02-10 HISTORY — PX: THYROIDECTOMY: SHX17

## 2017-02-10 SURGERY — THYROIDECTOMY
Anesthesia: General

## 2017-02-10 MED ORDER — ONDANSETRON HCL 4 MG/2ML IJ SOLN: 4.0000 mg | Freq: Four times a day (QID) | INTRAMUSCULAR | Status: DC | PRN

## 2017-02-10 MED ORDER — DIPHENHYDRAMINE HCL 50 MG/ML IJ SOLN: 12.5000 mg | Freq: Four times a day (QID) | INTRAMUSCULAR | Status: DC | PRN

## 2017-02-10 MED ORDER — BUPIVACAINE HCL (PF) 0.5 % IJ SOLN
INTRAMUSCULAR | Status: AC
  Filled 2017-02-10: qty 30

## 2017-02-10 MED ORDER — LACTATED RINGERS IV SOLN
INTRAVENOUS | Status: DC
  Administered 2017-02-10: 07:00:00 via INTRAVENOUS

## 2017-02-10 MED ORDER — FENTANYL CITRATE (PF) 250 MCG/5ML IJ SOLN
INTRAMUSCULAR | Status: AC
  Filled 2017-02-10: qty 5

## 2017-02-10 MED ORDER — PROPOFOL 10 MG/ML IV BOLUS
INTRAVENOUS | Status: DC | PRN
  Administered 2017-02-10: 140 mg via INTRAVENOUS
  Administered 2017-02-10: 20 mg via INTRAVENOUS

## 2017-02-10 MED ORDER — MORPHINE SULFATE (PF) 2 MG/ML IV SOLN: 2.0000 mg | INTRAVENOUS | Status: DC | PRN

## 2017-02-10 MED ORDER — MIDAZOLAM HCL 2 MG/2ML IJ SOLN
1.0000 mg | INTRAMUSCULAR | Status: DC
  Administered 2017-02-10: 2 mg via INTRAVENOUS

## 2017-02-10 MED ORDER — ENOXAPARIN SODIUM 40 MG/0.4ML ~~LOC~~ SOLN
40.0000 mg | SUBCUTANEOUS | Status: DC
  Administered 2017-02-11: 40 mg via SUBCUTANEOUS
  Filled 2017-02-10: qty 0.4

## 2017-02-10 MED ORDER — PHENYLEPHRINE HCL 10 MG/ML IJ SOLN
INTRAMUSCULAR | Status: DC | PRN
  Administered 2017-02-10 (×3): 40 ug via INTRAVENOUS

## 2017-02-10 MED ORDER — LACTATED RINGERS IV SOLN
INTRAVENOUS | Status: DC
  Administered 2017-02-10 – 2017-02-11 (×2): via INTRAVENOUS

## 2017-02-10 MED ORDER — SODIUM CHLORIDE 0.9 % IR SOLN
Status: DC | PRN
  Administered 2017-02-10: 1000 mL

## 2017-02-10 MED ORDER — FENTANYL CITRATE (PF) 100 MCG/2ML IJ SOLN
INTRAMUSCULAR | Status: DC | PRN
  Administered 2017-02-10: 50 ug via INTRAVENOUS
  Administered 2017-02-10 (×2): 100 ug via INTRAVENOUS

## 2017-02-10 MED ORDER — LEVOTHYROXINE SODIUM 100 MCG PO TABS
100.0000 ug | ORAL_TABLET | Freq: Every day | ORAL | Status: DC
  Administered 2017-02-11: 100 ug via ORAL
  Filled 2017-02-10: qty 1

## 2017-02-10 MED ORDER — MIDAZOLAM HCL 2 MG/2ML IJ SOLN
INTRAMUSCULAR | Status: AC
Start: 2017-02-10 — End: 2017-02-10
  Filled 2017-02-10: qty 2

## 2017-02-10 MED ORDER — SIMETHICONE 80 MG PO CHEW: 40.0000 mg | CHEWABLE_TABLET | Freq: Four times a day (QID) | ORAL | Status: DC | PRN

## 2017-02-10 MED ORDER — MENTHOL 3 MG MT LOZG
1.0000 | LOZENGE | OROMUCOSAL | Status: DC | PRN
  Administered 2017-02-10: 3 mg via ORAL
  Filled 2017-02-10: qty 9

## 2017-02-10 MED ORDER — KETOROLAC TROMETHAMINE 30 MG/ML IJ SOLN: 30.0000 mg | Freq: Once | INTRAMUSCULAR | Status: DC

## 2017-02-10 MED ORDER — CHLORHEXIDINE GLUCONATE CLOTH 2 % EX PADS: 6.0000 | MEDICATED_PAD | Freq: Once | CUTANEOUS | Status: DC

## 2017-02-10 MED ORDER — GLYCOPYRROLATE 0.2 MG/ML IV SOSY
PREFILLED_SYRINGE | INTRAVENOUS | Status: DC | PRN
  Administered 2017-02-10: 0.4 mg via INTRAVENOUS

## 2017-02-10 MED ORDER — PROPOFOL 10 MG/ML IV BOLUS
INTRAVENOUS | Status: AC
  Filled 2017-02-10: qty 20

## 2017-02-10 MED ORDER — ROCURONIUM BROMIDE 50 MG/5ML IV SOLN
INTRAVENOUS | Status: AC
  Filled 2017-02-10: qty 1

## 2017-02-10 MED ORDER — HYDROCODONE-ACETAMINOPHEN 5-325 MG PO TABS: 1.0000 | ORAL_TABLET | ORAL | Status: DC | PRN

## 2017-02-10 MED ORDER — ACETAMINOPHEN 500 MG PO TABS
1000.0000 mg | ORAL_TABLET | Freq: Four times a day (QID) | ORAL | Status: DC
  Administered 2017-02-10 – 2017-02-11 (×4): 1000 mg via ORAL
  Filled 2017-02-10 (×4): qty 2

## 2017-02-10 MED ORDER — HEMOSTATIC AGENTS (NO CHARGE) OPTIME
TOPICAL | Status: DC | PRN
  Administered 2017-02-10: 1 via TOPICAL

## 2017-02-10 MED ORDER — TRAMADOL HCL 50 MG PO TABS: 50.0000 mg | ORAL_TABLET | Freq: Four times a day (QID) | ORAL | Status: DC | PRN

## 2017-02-10 MED ORDER — BUPIVACAINE HCL (PF) 0.5 % IJ SOLN
INTRAMUSCULAR | Status: DC | PRN
  Administered 2017-02-10: 10 mL

## 2017-02-10 MED ORDER — ROCURONIUM BROMIDE 100 MG/10ML IV SOLN
INTRAVENOUS | Status: DC | PRN
  Administered 2017-02-10: 30 mg via INTRAVENOUS
  Administered 2017-02-10 (×2): 10 mg via INTRAVENOUS

## 2017-02-10 MED ORDER — ONDANSETRON HCL 4 MG/2ML IJ SOLN
4.0000 mg | Freq: Once | INTRAMUSCULAR | Status: AC
  Administered 2017-02-10: 4 mg via INTRAVENOUS

## 2017-02-10 MED ORDER — ONDANSETRON HCL 4 MG/2ML IJ SOLN
INTRAMUSCULAR | Status: AC
  Filled 2017-02-10: qty 2

## 2017-02-10 MED ORDER — FENTANYL CITRATE (PF) 100 MCG/2ML IJ SOLN
25.0000 ug | INTRAMUSCULAR | Status: DC | PRN
  Administered 2017-02-10 (×2): 50 ug via INTRAVENOUS
  Filled 2017-02-10: qty 2

## 2017-02-10 MED ORDER — NEOSTIGMINE METHYLSULFATE 5 MG/5ML IV SOSY
PREFILLED_SYRINGE | INTRAVENOUS | Status: DC | PRN
  Administered 2017-02-10: 3 mg via INTRAVENOUS

## 2017-02-10 MED ORDER — ONDANSETRON 4 MG PO TBDP: 4.0000 mg | ORAL_TABLET | Freq: Four times a day (QID) | ORAL | Status: DC | PRN

## 2017-02-10 MED ORDER — DIPHENHYDRAMINE HCL 12.5 MG/5ML PO ELIX: 12.5000 mg | ORAL_SOLUTION | Freq: Four times a day (QID) | ORAL | Status: DC | PRN

## 2017-02-10 SURGICAL SUPPLY — 59 items
APPLIER CLIP 11 MED OPEN (CLIP)
APPLIER CLIP 9.375 SM OPEN (CLIP) ×3
APR CLP MED 11 20 MLT OPN (CLIP)
ATTRACTOMAT 16X20 MAGNETIC DRP (DRAPES) ×3 IMPLANT
BAG HAMPER (MISCELLANEOUS) ×3 IMPLANT
BLADE SURG 15 STRL LF DISP TIS (BLADE) ×1 IMPLANT
BLADE SURG 15 STRL SS (BLADE) ×2
BLADE SURG SZ10 CARB STEEL (BLADE) ×3 IMPLANT
CHLORAPREP W/TINT 10.5 ML (MISCELLANEOUS) ×3 IMPLANT
CLIP APPLIE 11 MED OPEN (CLIP) IMPLANT
CLIP APPLIE 9.375 SM OPEN (CLIP) ×1 IMPLANT
CLOTH BEACON ORANGE TIMEOUT ST (SAFETY) ×3 IMPLANT
COVER LIGHT HANDLE STERIS (MISCELLANEOUS) ×6 IMPLANT
DECANTER SPIKE VIAL GLASS SM (MISCELLANEOUS) ×3 IMPLANT
DERMABOND ADVANCED (GAUZE/BANDAGES/DRESSINGS) ×2
DERMABOND ADVANCED .7 DNX12 (GAUZE/BANDAGES/DRESSINGS) ×1 IMPLANT
DRAPE LAPAROTOMY 77X122 PED (DRAPES) ×3 IMPLANT
DRAPE PROXIMA HALF (DRAPES) ×3 IMPLANT
ELECT NEEDLE TIP 2.8 STRL (NEEDLE) ×3 IMPLANT
ELECT REM PT RETURN 9FT ADLT (ELECTROSURGICAL) ×3
ELECTRODE REM PT RTRN 9FT ADLT (ELECTROSURGICAL) ×1 IMPLANT
FORMALIN 10 PREFIL 120ML (MISCELLANEOUS) ×3 IMPLANT
GAUZE SPONGE 4X4 16PLY XRAY LF (GAUZE/BANDAGES/DRESSINGS) ×6 IMPLANT
GLOVE BIOGEL PI IND STRL 7.0 (GLOVE) ×1 IMPLANT
GLOVE BIOGEL PI INDICATOR 7.0 (GLOVE) ×2
GLOVE SURG SS PI 7.5 STRL IVOR (GLOVE) ×3 IMPLANT
GOWN STRL REUS W/ TWL LRG LVL3 (GOWN DISPOSABLE) ×2 IMPLANT
GOWN STRL REUS W/TWL LRG LVL3 (GOWN DISPOSABLE) ×7 IMPLANT
HEMOSTAT ARISTA ABSORB 1G (MISCELLANEOUS) ×3 IMPLANT
HEMOSTAT SURGICEL 4X8 (HEMOSTASIS) ×3 IMPLANT
KIT BLADEGUARD II DBL (SET/KITS/TRAYS/PACK) ×3 IMPLANT
KIT ROOM TURNOVER APOR (KITS) ×3 IMPLANT
MANIFOLD NEPTUNE II (INSTRUMENTS) ×3 IMPLANT
MARKER SKIN DUAL TIP RULER LAB (MISCELLANEOUS) ×3 IMPLANT
NEEDLE HYPO 25X1 1.5 SAFETY (NEEDLE) ×3 IMPLANT
NS IRRIG 1000ML POUR BTL (IV SOLUTION) ×3 IMPLANT
PACK BASIC III (CUSTOM PROCEDURE TRAY) ×2
PACK SRG BSC III STRL LF ECLPS (CUSTOM PROCEDURE TRAY) ×1 IMPLANT
PAD ARMBOARD 7.5X6 YLW CONV (MISCELLANEOUS) ×3 IMPLANT
PENCIL HANDSWITCHING (ELECTRODE) ×3 IMPLANT
SET BASIN LINEN APH (SET/KITS/TRAYS/PACK) ×3 IMPLANT
SHEARS HARMONIC 9CM CVD (BLADE) ×3 IMPLANT
SPONGE DRAIN TRACH 4X4 STRL 2S (GAUZE/BANDAGES/DRESSINGS) IMPLANT
SPONGE INTESTINAL PEANUT (DISPOSABLE) ×15 IMPLANT
STAPLER VISISTAT 35W (STAPLE) ×3 IMPLANT
SUT ETHILON 4 0 PS 2 18 (SUTURE) IMPLANT
SUT SILK 2 0 (SUTURE) ×2
SUT SILK 2-0 18XBRD TIE 12 (SUTURE) ×1 IMPLANT
SUT SILK 3 0 (SUTURE) ×3
SUT SILK 3-0 18XBRD TIE 12 (SUTURE) IMPLANT
SUT SILK 3-0 FS1 18XBRD (SUTURE) ×1 IMPLANT
SUT VIC AB 2-0 CT2 27 (SUTURE) ×3 IMPLANT
SUT VIC AB 3-0 SH 27 (SUTURE) ×2
SUT VIC AB 3-0 SH 27X BRD (SUTURE) ×1 IMPLANT
SUT VIC AB 4-0 PS2 27 (SUTURE) ×3 IMPLANT
SYR CONTROL 10ML LL (SYRINGE) ×3 IMPLANT
SYSTEM CHEST DRAIN TLS 7FR (DRAIN) ×3 IMPLANT
TOWEL OR 17X26 4PK STRL BLUE (TOWEL DISPOSABLE) ×3 IMPLANT
YANKAUER SUCT BULB TIP 10FT TU (MISCELLANEOUS) ×3 IMPLANT

## 2017-02-10 NOTE — Anesthesia Preprocedure Evaluation (Signed)
Anesthesia Evaluation  Patient identified by MRN, date of birth, ID band Patient awake    Reviewed: Allergy & Precautions, NPO status , Patient's Chart, lab work & pertinent test results  Airway Mallampati: I  TM Distance: >3 FB     Dental  (+) Teeth Intact, Dental Advisory Given   Pulmonary former smoker,    breath sounds clear to auscultation       Cardiovascular hypertension,  Rhythm:Regular Rate:Normal     Neuro/Psych  Headaches, negative psych ROS   GI/Hepatic GERD  Controlled and Medicated,  Endo/Other    Renal/GU      Musculoskeletal   Abdominal   Peds  Hematology   Anesthesia Other Findings   Reproductive/Obstetrics                             Anesthesia Physical Anesthesia Plan  ASA: II  Anesthesia Plan: General   Post-op Pain Management:    Induction: Intravenous  PONV Risk Score and Plan:   Airway Management Planned: Oral ETT  Additional Equipment:   Intra-op Plan:   Post-operative Plan: Extubation in OR  Informed Consent: I have reviewed the patients History and Physical, chart, labs and discussed the procedure including the risks, benefits and alternatives for the proposed anesthesia with the patient or authorized representative who has indicated his/her understanding and acceptance.     Plan Discussed with:   Anesthesia Plan Comments:         Anesthesia Quick Evaluation

## 2017-02-10 NOTE — Interval H&P Note (Signed)
History and Physical Interval Note:  02/10/2017 7:21 AM  Joyce Martin  has presented today for surgery, with the diagnosis of thyroid nodule  The various methods of treatment have been discussed with the patient and family. After consideration of risks, benefits and other options for treatment, the patient has consented to  Procedure(s): THYROIDECTOMY (N/A) as a surgical intervention .  The patient's history has been reviewed, patient examined, no change in status, stable for surgery.  I have reviewed the patient's chart and labs.  Questions were answered to the patient's satisfaction.     Aviva Signs

## 2017-02-10 NOTE — Anesthesia Postprocedure Evaluation (Signed)
Anesthesia Post Note  Patient: Joyce Martin  Procedure(s) Performed: Procedure(s) (LRB): THYROIDECTOMY (N/A)  Patient location during evaluation: PACU Anesthesia Type: General Level of consciousness: awake and alert, oriented and patient cooperative Pain management: pain level controlled Vital Signs Assessment: post-procedure vital signs reviewed and stable Respiratory status: spontaneous breathing and aerosol facemask Cardiovascular status: stable Postop Assessment: no signs of nausea or vomiting Anesthetic complications: no     Last Vitals:  Vitals:   02/10/17 0700 02/10/17 0705  BP: (!) 168/102 (!) 166/95  Resp: 20 18  Temp:    SpO2: 99% 100%    Last Pain:  Vitals:   02/10/17 0640  TempSrc: Oral                 Rutledge Selsor A

## 2017-02-10 NOTE — Anesthesia Procedure Notes (Signed)
Procedure Name: Intubation Date/Time: 02/10/2017 7:42 AM Performed by: Jonna Munro Pre-anesthesia Checklist: Patient identified, Emergency Drugs available, Suction available, Patient being monitored and Timeout performed Patient Re-evaluated:Patient Re-evaluated prior to induction Oxygen Delivery Method: Circle system utilized Preoxygenation: Pre-oxygenation with 100% oxygen Induction Type: IV induction Ventilation: Mask ventilation without difficulty Laryngoscope Size: Mac and 3 Grade View: Grade I Tube type: Oral Tube size: 7.0 mm Number of attempts: 1 Airway Equipment and Method: Stylet Placement Confirmation: ETT inserted through vocal cords under direct vision,  positive ETCO2 and breath sounds checked- equal and bilateral Secured at: 21 cm Tube secured with: Tape Dental Injury: Teeth and Oropharynx as per pre-operative assessment

## 2017-02-10 NOTE — Transfer of Care (Signed)
Immediate Anesthesia Transfer of Care Note  Patient: Joyce Martin  Procedure(s) Performed: Procedure(s): THYROIDECTOMY (N/A)  Patient Location: PACU  Anesthesia Type:General  Level of Consciousness: awake, alert  and oriented  Airway & Oxygen Therapy: Patient Spontanous Breathing and Patient connected to nasal cannula oxygen  Post-op Assessment: Report given to RN and Post -op Vital signs reviewed and stable  Post vital signs: Reviewed and stable  Last Vitals:  Vitals:   02/10/17 0700 02/10/17 0705  BP: (!) 168/102 (!) 166/95  Resp: 20 18  Temp:    SpO2: 99% 100%    Last Pain:  Vitals:   02/10/17 0640  TempSrc: Oral         Complications: No apparent anesthesia complications

## 2017-02-10 NOTE — Op Note (Signed)
Patient:  Joyce Martin  DOB:  03-Oct-1950  MRN:  [id number]   Preop Diagnosis:  Left thyroid nodule, goiter  Postop Diagnosis:  Same  Procedure:  Total thyroidectomy  Surgeon:  Aviva Signs, M.D.  Anes:  Gen. endotracheal  Indications:  Patient is a 66 year old black female who was referred to my care for a left thyroid nodule which had atypical follicular cells seen. She also suffered from a goiter. The risks and benefits of the procedure including bleeding, infection, nerve injury, and the possibility of malignancy were fully explained to the patient, who gave informed consent.  Procedure note:  The patient was placed the supine position. After induction of general endotracheal anesthesia, the neck was prepped and draped using usual sterile technique with DuraPrep. Surgical site confirmation was performed.  An incision was made above the jugular notch. The platysma was divided without difficulty. A superior flap was performed to the larynx and the inferior flap formed to the jugular notch. The strap muscle was then divided longitudinally over the midline. The left lobe of the thyroid gland was then exposed. Using the Harmonic scalpel and clips, the inferior thyroidal artery and vein, middle thyroidal artery and vein, and the suspensory ligament of Berry were all ligated without difficulty. Care was taken to avoid the left recurrent laryngeal nerve. This was likewise done to the right thyroid lobe. There was a nodule that was palpable along the inferior aspect of the left thyroid lobe. No lymphadenopathy was noted. The thyroid gland was then taken off the trachea without difficulty. A suture was placed in the right lobe for orientation purposes. The specimen was sent to pathology for examination. Arista and Surgicel were placed in the thyroid bed. The strap muscle was reapproximated using a 2-0 Vicryl running suture. The subcutaneous layer was reapproximated using a 3-0 Vicryl running suture.  0.5% Sensorcaine was instilled into the surrounding wound. The skin was closed using a 4-0 Vicryl subcuticular suture. Dermabond was applied.  All tape and needle counts were correct at the end of the procedure. The patient was extubated in the operating room and transferred to PACU in stable condition. She was able to phonate the letter E without difficulty.  Complications:  None  EBL:  Minimal  Specimen:  Thyroid gland, suture right lobe

## 2017-02-10 NOTE — Addendum Note (Signed)
Addendum  created 02/10/17 1104 by Jonna Munro, CRNA   Charge Capture section accepted

## 2017-02-10 NOTE — Addendum Note (Signed)
Addendum  created 02/10/17 0942 by Jonna Munro, CRNA   Anesthesia Intra Meds edited

## 2017-02-10 NOTE — Interval H&P Note (Signed)
History and Physical Interval Note:  02/10/2017 7:28 AM  Joyce Martin  has presented today for surgery, with the diagnosis of thyroid nodule  The various methods of treatment have been discussed with the patient and family. After consideration of risks, benefits and other options for treatment, the patient has consented to  Procedure(s): THYROIDECTOMY (N/A) as a surgical intervention .  The patient's history has been reviewed, patient examined, no change in status, stable for surgery.  I have reviewed the patient's chart and labs.  Questions were answered to the patient's satisfaction.     Aviva Signs

## 2017-02-11 ENCOUNTER — Encounter (HOSPITAL_COMMUNITY): Payer: Self-pay | Admitting: General Surgery

## 2017-02-11 DIAGNOSIS — C73 Malignant neoplasm of thyroid gland: Secondary | ICD-10-CM | POA: Diagnosis not present

## 2017-02-11 LAB — COMPREHENSIVE METABOLIC PANEL
ALK PHOS: 51 U/L (ref 38–126)
ALT: 17 U/L (ref 14–54)
ANION GAP: 8 (ref 5–15)
AST: 22 U/L (ref 15–41)
Albumin: 3.2 g/dL — ABNORMAL LOW (ref 3.5–5.0)
BUN: 11 mg/dL (ref 6–20)
CALCIUM: 7.7 mg/dL — AB (ref 8.9–10.3)
CO2: 26 mmol/L (ref 22–32)
Chloride: 105 mmol/L (ref 101–111)
Creatinine, Ser: 1.02 mg/dL — ABNORMAL HIGH (ref 0.44–1.00)
GFR calc non Af Amer: 56 mL/min — ABNORMAL LOW (ref >60)
Glucose, Bld: 106 mg/dL — ABNORMAL HIGH (ref 65–99)
Potassium: 3.5 mmol/L (ref 3.5–5.1)
SODIUM: 139 mmol/L (ref 135–145)
TOTAL PROTEIN: 6.2 g/dL — AB (ref 6.5–8.1)
Total Bilirubin: 0.8 mg/dL (ref 0.3–1.2)

## 2017-02-11 LAB — CBC
HCT: 31.8 % — ABNORMAL LOW (ref 36.0–46.0)
HEMOGLOBIN: 10.7 g/dL — AB (ref 12.0–15.0)
MCH: 28.8 pg (ref 26.0–34.0)
MCHC: 33.6 g/dL (ref 30.0–36.0)
MCV: 85.7 fL (ref 78.0–100.0)
Platelets: 239 10*3/uL (ref 150–400)
RBC: 3.71 MIL/uL — AB (ref 3.87–5.11)
RDW: 13.9 % (ref 11.5–15.5)
WBC: 6.6 10*3/uL (ref 4.0–10.5)

## 2017-02-11 LAB — MRSA PCR SCREENING: MRSA BY PCR: NEGATIVE

## 2017-02-11 MED ORDER — TRAMADOL HCL 50 MG PO TABS: 50.0000 mg | ORAL_TABLET | Freq: Four times a day (QID) | ORAL | 0 refills | Status: DC | PRN

## 2017-02-11 MED ORDER — LEVOTHYROXINE SODIUM 100 MCG PO TABS: 100.0000 ug | ORAL_TABLET | Freq: Every day | ORAL | 1 refills | Status: DC

## 2017-02-11 NOTE — Discharge Instructions (Signed)
Thyroidectomy, Care After °Refer to this sheet in the next few weeks. These instructions provide you with information about caring for yourself after your procedure. Your health care provider may also give you more specific instructions. Your treatment has been planned according to current medical practices, but problems sometimes occur. Call your health care provider if you have any problems or questions after your procedure. °What can I expect after the procedure? °After your procedure, it is typical to have: °· Mild pain in the neck or upper body, especially when swallowing. °· A sore throat. °· A weak voice. ° °Follow these instructions at home: °· Take medicines only as directed by your health care provider. °· If your entire thyroid gland was removed, you may need to take thyroid hormone medicine from now on. °· Do not take medicines that contain aspirin and ibuprofen until your health care provider says that you can. These medicines can increase your risk of bleeding. °· Some pain medicines cause constipation. Drink enough fluid to keep your urine clear or pale yellow. This can help to prevent constipation. °· Start slowly with eating. You may need to have only liquids and soft foods for a few days or as directed by your health care provider. °· Do not take baths, swim, or use a hot tub until your health care provider approves. °· There are many different ways to close and cover an incision, including stitches (sutures), skin glue, and adhesive strips. Follow your health care provider's instructions for: °? Incision care. °? Bandage (dressing) changes and removal. °? Incision closure removal. °· Resume your usual activities as directed by your health care provider. °· For the first 10 days after the procedure or as instructed by your health care provider: °? Do not lift anything heavier than 20 lb (9.1 kg). °? Do not jog, swim, or do other strenuous exercises. °? Do not play contact sports. °· Keep all  follow-up visits as directed by your health care provider. This is important. °Contact a health care provider if: °· The soreness in your throat gets worse. °· You have increased pain at your incision or incisions. °· You have increased bleeding from an incision. °· Your incision becomes infected. Watch for: °? Swelling. °? Redness. °? Warmth. °? Pus. °· You notice a bad smell coming from an incision or dressing. °· You have a fever. °· You feel lightheaded or faint. °· You have numbness, tingling, or muscle spasms in your: °? Arms. °? Hands. °? Feet. °? Face. °· You have trouble swallowing. °Get help right away if: °· You develop a rash. °· You have difficulty breathing. °· You hear whistling noises coming from your chest. °· You develop a cough that gets worse. °· Your speech changes, or you have hoarseness that gets worse. °This information is not intended to replace advice given to you by your health care provider. Make sure you discuss any questions you have with your health care provider. °Document Released: 01/04/2005 Document Revised: 02/18/2016 Document Reviewed: 11/17/2013 °Elsevier Interactive Patient Education © 2018 [employer] ° °

## 2017-02-11 NOTE — Anesthesia Postprocedure Evaluation (Signed)
Anesthesia Post Note  Patient: Joyce Martin  Procedure(s) Performed: Procedure(s) (LRB): THYROIDECTOMY (N/A)  Patient location during evaluation: Nursing Unit Anesthesia Type: General Level of consciousness: awake and alert Pain management: satisfactory to patient Vital Signs Assessment: post-procedure vital signs reviewed and stable Respiratory status: spontaneous breathing Cardiovascular status: stable Postop Assessment: no signs of nausea or vomiting Anesthetic complications: no     Last Vitals:  Vitals:   02/11/17 0200 02/11/17 0500  BP: (!) 146/75 (!) 146/69  Pulse: 76 76  Resp: 20 20  Temp: 37.3 C   SpO2: 100% 99%    Last Pain:  Vitals:   02/11/17 0643  TempSrc:   PainSc: 0-No pain                 Parry Po

## 2017-02-11 NOTE — Discharge Summary (Signed)
Physician Discharge Summary  Patient ID: Joyce Martin MRN: [id number] DOB/AGE: 1950/10/25 66 y.o.  Admit date: 02/10/2017 Discharge date: 02/11/2017  Admission Diagnoses:Left thyroid nodule, goiter  Discharge Diagnoses: Same Active Problems:   Thyroid nodule   S/P total thyroidectomy   Discharged Condition: good  Hospital Course: Patient is a 66 year old black female who presented [facility] for total thyroidectomy. This was performed 02/10/2017. She tolerated the procedure well. Her postoperative course has been unremarkable. Her diet was advanced to difficulty. Her calciums have remained within normal limits as compared to albumin. She is able to talk without difficulty. She is being discharged home on postoperative day 1 in good and improving condition. Final pathology pending. Patient will be taking by mouth over-the-counter calcium chews.  Treatments: surgery: Total thyroidectomy on 02/10/2017  Discharge Exam: Blood pressure (!) 146/69, pulse 76, temperature 99.1 F (37.3 C), temperature source Oral, resp. rate 20, height 5\' 8"  (1.727 m), weight 176 lb 5.9 oz (80 kg), SpO2 99 %. Head: Normocephalic, without obvious abnormality, atraumatic Neck: Incision healing well.No definite swelling noted. Resp: clear to auscultation bilaterally Cardio: regular rate and rhythm, S1, S2 normal, no murmur, click, rub or gallop  Disposition: 01-Home or Self Care  Discharge Instructions    Diet - low sodium heart healthy    Complete by:  As directed    Increase activity slowly    Complete by:  As directed      Allergies as of 02/11/2017      Reactions   Aspirin Nausea Only   Other Other (See Comments)   COMBID: for stomach -twisting pt's face       Medication List    TAKE these medications   cromolyn 4 % ophthalmic solution Commonly known as:  OPTICROM Place 1 drop into both eyes every other day.   HAIR SKIN & NAILS GUMMIES PO Take 1 each by mouth 2 (two) times  daily.   ibuprofen 200 MG tablet Commonly known as:  ADVIL,MOTRIN Take 400 mg by mouth daily as needed for headache.   levothyroxine 100 MCG tablet Commonly known as:  SYNTHROID, LEVOTHROID Take 1 tablet (100 mcg total) by mouth daily before breakfast.   pantoprazole 40 MG tablet Commonly known as:  PROTONIX Take 1 tablet (40 mg total) by mouth daily.   traMADol 50 MG tablet Commonly known as:  ULTRAM Take 1 tablet (50 mg total) by mouth every 6 (six) hours as needed (mild pain).      Follow-up Information    Aviva Signs, MD. Schedule an appointment as soon as possible for a visit on 02/18/2017.   Specialty:  General Surgery Contact information: [id number] [place] [address] [phone]           Signed: Aviva Signs 02/11/2017, 8:32 AM

## 2017-02-11 NOTE — Addendum Note (Signed)
Addendum  created 02/11/17 0808 by Vista Deck, CRNA   Sign clinical note

## 2017-02-11 NOTE — Progress Notes (Signed)
Joyce Martin discharged Home per MD order.  Discharge instructions reviewed and discussed with the patient, all questions and concerns answered. Copy of instructions and scripts given to patient.  Allergies as of 02/11/2017      Reactions   Aspirin Nausea Only   Other Other (See Comments)   COMBID: for stomach -twisting pt's face       Medication List    TAKE these medications   cromolyn 4 % ophthalmic solution Commonly known as:  OPTICROM Place 1 drop into both eyes every other day.   HAIR SKIN & NAILS GUMMIES PO Take 1 each by mouth 2 (two) times daily.   ibuprofen 200 MG tablet Commonly known as:  ADVIL,MOTRIN Take 400 mg by mouth daily as needed for headache.   levothyroxine 100 MCG tablet Commonly known as:  SYNTHROID, LEVOTHROID Take 1 tablet (100 mcg total) by mouth daily before breakfast.   pantoprazole 40 MG tablet Commonly known as:  PROTONIX Take 1 tablet (40 mg total) by mouth daily.   traMADol 50 MG tablet Commonly known as:  ULTRAM Take 1 tablet (50 mg total) by mouth every 6 (six) hours as needed (mild pain).       Patients skin is clean, dry and intact, no evidence of skin break down. IV site discontinued and catheter remains intact. Site without signs and symptoms of complications. Dressing and pressure applied.  Patient escorted to car by NT in a wheelchair,  no distress noted upon discharge.  Ralene Muskrat Annebelle Bostic 02/11/2017 12:03 PM

## 2017-02-13 ENCOUNTER — Other Ambulatory Visit (HOSPITAL_COMMUNITY): Payer: Medicare Other

## 2017-02-18 ENCOUNTER — Ambulatory Visit (INDEPENDENT_AMBULATORY_CARE_PROVIDER_SITE_OTHER): Payer: Self-pay | Admitting: General Surgery

## 2017-02-18 ENCOUNTER — Encounter: Payer: Self-pay | Admitting: General Surgery

## 2017-02-18 VITALS — BP 166/93 | HR 98 | Temp 98.1°F | Resp 18 | Ht 68.0 in | Wt 173.0 lb

## 2017-02-18 DIAGNOSIS — Z09 Encounter for follow-up examination after completed treatment for conditions other than malignant neoplasm: Secondary | ICD-10-CM

## 2017-02-18 NOTE — Patient Instructions (Signed)
Thyroid Cancer °The thyroid is a butterfly-shaped gland in the neck. The thyroid makes hormones that control functions like heart rate, blood pressure, temperature, and weight. Thyroid cancer is an abnormal growth of cells in the thyroid. These cells grow and multiply rapidly and do not die as normal cells do. °There are four main types of thyroid cancer: °· Papillary cancer. This is the least harmful type of thyroid cancer. It typically affects women of child-bearing age. °· Follicular cancer. This type of cancer is the most likely to come back after treatment and spread to other parts of the body. °· Medullary cancer. Medullary cancer can be passed down through families. A person who inherits the gene for this cancer is at high risk for developing the cancer. °· Anaplastic cancer. This type of thyroid cancer spreads quickly to the windpipe (trachea) and causes breathing problems. This type of cancer is most common in people 65 years of age or older. ° °What are the causes? °The cause of thyroid cancer is not known. °What increases the risk? °Risk factors for thyroid cancer include: °· Radiation exposure or radiation treatments to your head and neck during infancy or childhood. °· Having an enlarged thyroid. °· Having a family history of thyroid disease or inheriting family conditions. °· Being female. °· Being Asian. ° °What are the signs or symptoms? °Symptoms of thyroid cancer may include: °· Larger-than-normal thyroid gland. °· Lumps or swelling in your neck. °· Hoarseness or a change in your voice. °· A cough. °· Coughing up blood. °· Difficulty swallowing. °· Shortness of breath. ° °How is this diagnosed? °Your health care provider will examine your thyroid during a physical exam. He or she may order tests such as: °· An imaging study using sound waves and a computer (ultrasound). °· Blood tests. °· A tissue sample test (biopsy). ° °Other tests may be ordered to see if your cancer has spread. These tests may  include CT, MRI, or PET scans. Your health care provider may see if you have any genetic changes that may put you at risk for other types of cancer. °How is this treated? °Most thyroid cancers are treated with a surgery to remove most or all of the thyroid gland (thyroidectomy). In some cases, treatment also involves removing the lymph nodes in the neck that are close to the thyroid. °After surgery, you may have: °· Thyroid hormone therapy. The therapy is done to: °? Replace the hormone in the body that is normally made by the thyroid. °? Suppress a thyroid-stimulating hormone (TSH) that activates the thyroid and makes any remaining cancer cells grow. °· Radioactive iodine treatment. This treatment is often used to destroy any remaining thyroid tissue and cancerous tissue that could not be seen and was not removed during the thyroidectomy. This treatment may also be used to treat thyroid cancer that has spread to other parts of the body or has recurred. °· External radiation. This is typically done to treat thyroid cancer that has spread to the bones. °· Chemotherapy treatment. This is medicine that kills cancer cells and prevents them from growing. °· Alcohol ablation. This kills cancer cells by injecting alcohol into the cells. °· Biologic therapy or immunotherapy. This uses the body’s own immune system to fight cancer cells. ° °Follow these instructions at home: °· Take medicines only as directed by your health care provider. °· Keep all follow-up visits as directed by your health care provider. This is important. °Contact a health care provider if: °· You   feel nauseous or you vomit. °· You have diarrhea. °· You have a rash. °· You have problems with urinating, such as: °? Having a burning sensation. °? Needing to urinate more often than usual. °? Pain or difficulty urinating. °? Having blood in your urine. °· You have a new cough. °· You have symptoms of too much thyroid hormone, such as: °? Nervousness or  anxiety. °? Unintentional weight loss. °? Sweating. °? Difficulty sleeping. °? Hair loss. °? Heart palpitations. °? Frequent bowel movements. °· You have symptoms of too little thyroid hormone, such as: °? Fatigue. °? Puffiness in your face, hands, or feet. °? Unintentional weight gain. °? Feeling cold. °? Constipation. °· You have a fever. °Get help right away if: °· You have chest pain. °· You have shortness of breath. °· You suddenly feel too weak or dizzy to stand or walk. °This information is not intended to replace advice given to you by your health care provider. Make sure you discuss any questions you have with your health care provider. °Document Released: 05/10/2004 Document Revised: 02/18/2016 Document Reviewed: 11/18/2013 °Elsevier Interactive Patient Education © 2017 [employer]. ° °

## 2017-02-18 NOTE — Progress Notes (Signed)
Subjective:     Joyce Martin  Status post total thyroidectomy. Doing well. Mild incisional pain. No voice changes. She states her dysphagia is much improved. She is pleased with the results. Objective:    BP (!) 166/93   Pulse 98   Temp 98.1 F (36.7 C)   Resp 18   Ht 5\' 8"  (1.727 m)   Wt 173 lb (78.5 kg)   BMI 26.30 kg/m   General:  alert, cooperative and no distress  Neck is supple. Incision healing well. No significant swelling noted. 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 Patient notified of results.     Assessment:    Doing well postoperatively.    Plan:   Literature given about thyroid cancer. Patient will call Dr. Dorris Fetch for follow-up. Follow-up here as needed.

## 2017-03-11 ENCOUNTER — Ambulatory Visit: Payer: Medicare Other | Admitting: "Endocrinology

## 2017-03-26 LAB — T4, FREE: FREE T4: 1.2 ng/dL (ref 0.8–1.8)

## 2017-03-26 LAB — TSH: TSH: 1.98 m[IU]/L (ref 0.40–4.50)

## 2017-04-08 ENCOUNTER — Ambulatory Visit (INDEPENDENT_AMBULATORY_CARE_PROVIDER_SITE_OTHER): Payer: Medicare Other | Admitting: "Endocrinology

## 2017-04-08 ENCOUNTER — Encounter: Payer: Self-pay | Admitting: "Endocrinology

## 2017-04-08 VITALS — BP 148/82 | HR 91 | Ht 68.0 in | Wt 178.0 lb

## 2017-04-08 DIAGNOSIS — C73 Malignant neoplasm of thyroid gland: Secondary | ICD-10-CM

## 2017-04-08 DIAGNOSIS — E890 Postprocedural hypothyroidism: Secondary | ICD-10-CM | POA: Diagnosis not present

## 2017-04-08 NOTE — Progress Notes (Signed)
Subjective:    Patient ID: Joyce Martin, female    DOB: 01-17-1951, PCP Lemmie Evens, MD   Past Medical History:  Diagnosis Date  . Elevated BP 07/26/2014  . GERD (gastroesophageal reflux disease)   . Migraines   . 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 COLONOSCOPY N/A 04/18/2016   Procedure: COLONOSCOPY;  Surgeon: Danie Binder, MD;  Location: [facility];  Service: Endoscopy;  Laterality: N/A;  9:30 AM  . THYROIDECTOMY N/A 02/10/2017   Procedure: THYROIDECTOMY;  Surgeon: Aviva Signs, MD;  Location: [facility];  Service: General;  Laterality: N/A;  . TUBAL LIGATION     Social History   Social History  . Marital status: Divorced    Spouse name: N/A  . Number of children: N/A  . Years of education: N/A   Social History Main Topics  . Smoking status: Former Smoker    Packs/day: 0.25    Years: 5.00    Types: Cigarettes    Quit date: 01/30/1979  . Smokeless tobacco: Never Used  . Alcohol use No  . Drug use: No  . Sexual activity: Yes    Birth control/ protection: Post-menopausal   Other Topics Concern  . None   Social History Narrative  . None   Outpatient Encounter Prescriptions as of 04/08/2017  Medication Sig  . Biotin w/ Vitamins C & E (HAIR SKIN & NAILS GUMMIES PO) Take 1 each by mouth 2 (two) times daily.  . cromolyn (OPTICROM) 4 % ophthalmic solution Place 1 drop into both eyes every other day.  . ibuprofen (ADVIL,MOTRIN) 200 MG tablet Take 400 mg by mouth daily as needed for headache.   . levothyroxine (SYNTHROID, LEVOTHROID) 100 MCG tablet Take 1 tablet (100 mcg total) by mouth daily before breakfast.  . pantoprazole (PROTONIX) 40 MG tablet Take 1 tablet (40 mg total) by mouth daily. (Patient not taking: Reported on 01/31/2017)  . traMADol (ULTRAM) 50 MG tablet Take 1 tablet (50 mg total) by mouth every 6 (six) hours as needed (mild pain).   No facility-administered encounter  medications on file as of 04/08/2017.    ALLERGIES: Allergies  Allergen Reactions  . Aspirin Nausea Only  . Other Other (See Comments)    COMBID: for stomach -twisting pt's face     VACCINATION STATUS: Immunization History  Administered Date(s) Administered  . Tdap 02/28/2011    HPI  66 year old female patient with medical history as above. She is being seen in  Follow-up for toxic nodule on the left lower her thyroid . -She denies any prior history of thyroid dysfunction. She is not currently on thyroid hormone supplements nor antithyroid therapy. She denies family history of thyroid dysfunction. She was found to have warm nodule was 22% uptake on nuclear medicine test on her thyroid on 10/24/2016. - Heart thyroid function tests from 10/01/2016 showed TSH 1.6, total T4 9 .2. - She has vaguely localized pain on the right side of her neck anteriorly.  - She denies dysphagia, shortness of breath, voice change. - She has fluctuating body weight however gained 15 pounds over the last year.  - She denies palpitations, heat/cold intolerance, and tremors.  Review of Systems  Constitutional: + steady weight,  no fatigue, no subjective hyperthermia, no subjective hypothermia Eyes: no blurry vision, no xerophthalmia ENT: no sore throat, no nodules palpated in throat, no dysphagia/odynophagia, no hoarseness Cardiovascular: no Chest Pain,  no Shortness of Breath, no palpitations, no leg swelling Respiratory: no cough, no SOB Gastrointestinal: no Nausea/Vomiting/Diarhhea Musculoskeletal: no muscle/joint aches Skin: no rashes Neurological: no tremors, no numbness, no tingling, no dizziness Psychiatric: no depression, no anxiety  Objective:    BP (!) 148/82   Pulse 91   Ht '5\' 8"'  (1.727 m)   Wt 178 lb (80.7 kg)   BMI 27.06 kg/m   Wt Readings from Last 3 Encounters:  04/08/17 178 lb (80.7 kg)  02/18/17 173 lb (78.5 kg)  02/10/17 176 lb 5.9 oz (80 kg)    Physical  Exam  Constitutional: Slightly over weight for height, not in acute distress, normal state of mind Eyes: PERRLA, EOMI, no exophthalmos ENT: moist mucous membranes, + Healing post thyroidectomy scar on anterior lower neck, no cervical lymphadenopathy Cardiovascular: normal precordial activity, Regular Rate and Rhythm, no Murmur/Rubs/Gallops Respiratory:  adequate breathing efforts, no gross chest deformity, Clear to auscultation bilaterally Gastrointestinal: abdomen soft, Non -tender, No distension, Bowel Sounds present Musculoskeletal: no gross deformities, strength intact in all four extremities Skin: moist, warm, no rashes Neurological:  + mild tremor with outstretched hands, Deep tendon reflexes normal in all four extremities.   1)  - Dedicated thyroid ultrasound showed 2.3 cm nodule on the left lobe and 2 nodules on the isthmus (0.8 cm and 0.7 cm.)  2)  Fine needle aspiration psychologic Diagnosis on 01/09/2017 showed: THYROID, FINE NEEDLE ASPIRATION, LEFT (SPECIMEN 1 OF 1 COLLECTED 71/12/18) ATYPIA OF UNDETERMINED SIGNIFICANCE OR FOLLICULAR LESION OF UNDETERMINED SIGNIFICANCE (BETHESDA CATEGORY III).  3) Thyroid, thyroidectomy- 02/10/2017. 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 - TUMOR CONFINED WITHIN RENAL CAPSULE. - SEPARATE ADENOMATOUS NODULES. - BENIGN PARATHYROID GLAND.   Recent Results (from the past 2160 hour(s))  CBC with Differential/Platelet     Status: Abnormal   Collection Time: 02/05/17  2:45 PM  Result Value Ref Range   WBC 5.6 4.0 - 10.5 K/uL   RBC 4.13 3.87 - 5.11 MIL/uL   Hemoglobin 11.8 (L) 12.0 - 15.0 g/dL   HCT 35.0 (L) 36.0 - 46.0 %   MCV 84.7 78.0 - 100.0 fL   MCH 28.6 26.0 - 34.0 pg   MCHC 33.7 30.0 - 36.0 g/dL   RDW 13.7 11.5 - 15.5 %   Platelets 276 150 - 400 K/uL   Neutrophils Relative % 53 %   Neutro Abs 3.0 1.7 - 7.7 K/uL   Lymphocytes Relative 39 %   Lymphs Abs 2.2 0.7 - 4.0 K/uL   Monocytes Relative 6 %   Monocytes  Absolute 0.3 0.1 - 1.0 K/uL   Eosinophils Relative 1 %   Eosinophils Absolute 0.1 0.0 - 0.7 K/uL   Basophils Relative 1 %   Basophils Absolute 0.0 0.0 - 0.1 K/uL  Comprehensive metabolic panel     Status: Abnormal   Collection Time: 02/05/17  2:45 PM  Result Value Ref Range   Sodium 143 135 - 145 mmol/L   Potassium 3.9 3.5 - 5.1 mmol/L   Chloride 108 101 - 111 mmol/L   CO2 28 22 - 32 mmol/L   Glucose, Bld 109 (H) 65 - 99 mg/dL   BUN 14 6 - 20 mg/dL   Creatinine, Ser 1.08 (H) 0.44 - 1.00 mg/dL   Calcium 9.7 8.9 - 10.3 mg/dL   Total Protein 7.6 6.5 - 8.1 g/dL   Albumin 4.1 3.5 - 5.0 g/dL   AST 24 15 - 41 U/L   ALT 20 14 - 54 U/L  Alkaline Phosphatase 64 38 - 126 U/L   Total Bilirubin 0.7 0.3 - 1.2 mg/dL   GFR calc non Af Amer 52 (L) >60 mL/min   GFR calc Af Amer >60 >60 mL/min    Comment: (NOTE) The eGFR has been calculated using the CKD EPI equation. This calculation has not been validated in all clinical situations. eGFR's persistently <60 mL/min signify possible Chronic Kidney Disease.    Anion gap 7 5 - 15  MRSA PCR Screening     Status: None   Collection Time: 02/11/17 12:35 AM  Result Value Ref Range   MRSA by PCR NEGATIVE NEGATIVE    Comment:        The GeneXpert MRSA Assay (FDA approved for NASAL specimens only), is one component of a comprehensive MRSA colonization surveillance program. It is not intended to diagnose MRSA infection nor to guide or monitor treatment for MRSA infections.   Comprehensive metabolic panel     Status: Abnormal   Collection Time: 02/11/17  5:27 AM  Result Value Ref Range   Sodium 139 135 - 145 mmol/L   Potassium 3.5 3.5 - 5.1 mmol/L   Chloride 105 101 - 111 mmol/L   CO2 26 22 - 32 mmol/L   Glucose, Bld 106 (H) 65 - 99 mg/dL   BUN 11 6 - 20 mg/dL   Creatinine, Ser 1.02 (H) 0.44 - 1.00 mg/dL   Calcium 7.7 (L) 8.9 - 10.3 mg/dL   Total Protein 6.2 (L) 6.5 - 8.1 g/dL   Albumin 3.2 (L) 3.5 - 5.0 g/dL   AST 22 15 - 41 U/L   ALT  17 14 - 54 U/L   Alkaline Phosphatase 51 38 - 126 U/L   Total Bilirubin 0.8 0.3 - 1.2 mg/dL   GFR calc non Af Amer 56 (L) >60 mL/min   GFR calc Af Amer >60 >60 mL/min    Comment: (NOTE) The eGFR has been calculated using the CKD EPI equation. This calculation has not been validated in all clinical situations. eGFR's persistently <60 mL/min signify possible Chronic Kidney Disease.    Anion gap 8 5 - 15  CBC     Status: Abnormal   Collection Time: 02/11/17  5:27 AM  Result Value Ref Range   WBC 6.6 4.0 - 10.5 K/uL   RBC 3.71 (L) 3.87 - 5.11 MIL/uL   Hemoglobin 10.7 (L) 12.0 - 15.0 g/dL   HCT 31.8 (L) 36.0 - 46.0 %   MCV 85.7 78.0 - 100.0 fL   MCH 28.8 26.0 - 34.0 pg   MCHC 33.6 30.0 - 36.0 g/dL   RDW 13.9 11.5 - 15.5 %   Platelets 239 150 - 400 K/uL  T4, free     Status: None   Collection Time: 03/26/17  9:28 AM  Result Value Ref Range   Free T4 1.2 0.8 - 1.8 ng/dL  TSH     Status: None   Collection Time: 03/26/17  9:28 AM  Result Value Ref Range   TSH 1.98 0.40 - 4.50 mIU/L    Assessment & Plan:   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   -  Due to the fact that it is follicular variant and her relative youth, she will need adjuvant ablative therapy. She will be  scheduled for  thyroid remnant ablation utilizing I-131 with post therapy whole-body scan. - She will have thyroglobulin levels measured on the day of scan. - This treatment will be scheduled to be done as soon as possible in  [facility] in [place].   2  Post thyroidectomy Hypothyroidism - Her recent thyroid function tests are consistent with appropriate replacement with levothyroxine currently at 100 g by mouth every morning.  -  I advised her to continue levothyroxine 100 g by mouth every morning for now. - - We discussed about correct intake of levothyroxine, at fasting, with water, separated by at least 30 minutes from breakfast, and separated by more than 4 hours from calcium, iron, multivitamins, acid reflux medications (PPIs). -Patient is made aware of the fact that thyroid hormone replacement is needed for life, dose to be adjusted by periodic monitoring of thyroid function tests.  - I advised patient to maintain close follow up with Lemmie Evens, MD for primary care needs. Follow up plan: Return in about 8 weeks (around 06/03/2017) for follow up with Whole Body Scan w/Thyrogen.  Glade Lloyd, MD Phone: [phone]  Fax: [phone]  -  This note was partially dictated with voice recognition software. Similar sounding words can be transcribed inadequately or may not  be corrected upon review.  04/08/2017, 1:15 PM

## 2017-04-14 ENCOUNTER — Other Ambulatory Visit: Payer: Self-pay | Admitting: "Endocrinology

## 2017-04-14 DIAGNOSIS — C73 Malignant neoplasm of thyroid gland: Secondary | ICD-10-CM

## 2017-04-16 ENCOUNTER — Ambulatory Visit (HOSPITAL_COMMUNITY): Payer: Medicare Other

## 2017-04-17 ENCOUNTER — Ambulatory Visit (HOSPITAL_COMMUNITY): Payer: Medicare Other

## 2017-04-18 ENCOUNTER — Ambulatory Visit (HOSPITAL_COMMUNITY): Payer: Medicare Other

## 2017-04-23 ENCOUNTER — Encounter (HOSPITAL_COMMUNITY): Payer: Self-pay

## 2017-04-23 ENCOUNTER — Encounter (HOSPITAL_COMMUNITY)
Admission: RE | Admit: 2017-04-23 | Discharge: 2017-04-23 | Disposition: A | Discharge status: Home / Self Care | Payer: Medicare Other | Source: Ambulatory Visit | Attending: "Endocrinology | Admitting: "Endocrinology

## 2017-04-23 DIAGNOSIS — Z9889 Other specified postprocedural states: Secondary | ICD-10-CM | POA: Diagnosis not present

## 2017-04-23 DIAGNOSIS — C73 Malignant neoplasm of thyroid gland: Secondary | ICD-10-CM | POA: Diagnosis present

## 2017-04-23 MED ORDER — THYROTROPIN ALFA 1.1 MG IM SOLR
INTRAMUSCULAR | Status: AC
  Administered 2017-04-23: 0.9 mg via INTRAMUSCULAR
  Filled 2017-04-23: qty 0.9

## 2017-04-23 MED ORDER — STERILE WATER FOR INJECTION IJ SOLN
INTRAMUSCULAR | Status: AC
  Administered 2017-04-23: 1 mL
  Filled 2017-04-23: qty 10

## 2017-04-23 MED ORDER — THYROTROPIN ALFA 1.1 MG IM SOLR
0.9000 mg | INTRAMUSCULAR | Status: AC
  Administered 2017-04-23: 0.9 mg via INTRAMUSCULAR

## 2017-04-24 ENCOUNTER — Encounter (HOSPITAL_COMMUNITY)
Admission: RE | Admit: 2017-04-24 | Discharge: 2017-04-24 | Disposition: A | Discharge status: Home / Self Care | Payer: Medicare Other | Source: Ambulatory Visit | Attending: "Endocrinology | Admitting: "Endocrinology

## 2017-04-24 DIAGNOSIS — C73 Malignant neoplasm of thyroid gland: Secondary | ICD-10-CM | POA: Diagnosis not present

## 2017-04-24 MED ORDER — STERILE WATER FOR INJECTION IJ SOLN
INTRAMUSCULAR | Status: AC
  Administered 2017-04-24: 1 mL
  Filled 2017-04-24: qty 10

## 2017-04-24 MED ORDER — THYROTROPIN ALFA 1.1 MG IM SOLR
0.9000 mg | INTRAMUSCULAR | Status: AC
  Administered 2017-04-24: 0.9 mg via INTRAMUSCULAR

## 2017-04-24 MED ORDER — THYROTROPIN ALFA 1.1 MG IM SOLR
INTRAMUSCULAR | Status: AC
  Administered 2017-04-24: 0.9 mg via INTRAMUSCULAR
  Filled 2017-04-24: qty 0.9

## 2017-04-25 ENCOUNTER — Encounter (HOSPITAL_COMMUNITY)
Admission: RE | Admit: 2017-04-25 | Discharge: 2017-04-25 | Disposition: A | Discharge status: Home / Self Care | Payer: Medicare Other | Source: Ambulatory Visit | Attending: "Endocrinology | Admitting: "Endocrinology

## 2017-04-25 ENCOUNTER — Other Ambulatory Visit (HOSPITAL_COMMUNITY)
Admission: RE | Admit: 2017-04-25 | Discharge: 2017-04-25 | Disposition: A | Discharge status: Home / Self Care | Payer: Medicare Other | Source: Ambulatory Visit | Attending: "Endocrinology | Admitting: "Endocrinology

## 2017-04-25 DIAGNOSIS — C73 Malignant neoplasm of thyroid gland: Secondary | ICD-10-CM | POA: Diagnosis not present

## 2017-04-25 MED ORDER — SODIUM IODIDE I 131 CAPSULE
70.0000 | Freq: Once | INTRAVENOUS | Status: AC | PRN
  Administered 2017-04-25: 69.9 via ORAL

## 2017-04-28 ENCOUNTER — Other Ambulatory Visit (HOSPITAL_COMMUNITY): Payer: Medicare Other

## 2017-05-05 ENCOUNTER — Other Ambulatory Visit (HOSPITAL_COMMUNITY)
Admission: RE | Admit: 2017-05-05 | Discharge: 2017-05-05 | Disposition: A | Discharge status: Home / Self Care | Payer: Medicare Other | Source: Ambulatory Visit | Attending: "Endocrinology | Admitting: "Endocrinology

## 2017-05-05 ENCOUNTER — Encounter (HOSPITAL_COMMUNITY)
Admission: RE | Admit: 2017-05-05 | Discharge: 2017-05-05 | Disposition: A | Discharge status: Home / Self Care | Payer: Medicare Other | Source: Ambulatory Visit | Attending: "Endocrinology | Admitting: "Endocrinology

## 2017-05-05 DIAGNOSIS — C73 Malignant neoplasm of thyroid gland: Secondary | ICD-10-CM | POA: Insufficient documentation

## 2017-05-06 LAB — THYROGLOBULIN ANTIBODY: Thyroglobulin Antibody: <1 IU/mL (ref 0.0–0.9)

## 2017-05-13 ENCOUNTER — Other Ambulatory Visit: Payer: Self-pay

## 2017-05-13 MED ORDER — LEVOTHYROXINE SODIUM 100 MCG PO TABS: 100.0000 ug | ORAL_TABLET | Freq: Every day | ORAL | 1 refills | Status: DC

## 2017-05-14 LAB — THYROGLOBULIN LEVEL: Thyroglobulin: 37 ng/mL

## 2017-05-29 ENCOUNTER — Other Ambulatory Visit: Payer: Self-pay

## 2017-05-29 MED ORDER — LEVOTHYROXINE SODIUM 100 MCG PO TABS: 100.0000 ug | ORAL_TABLET | Freq: Every day | ORAL | 1 refills | Status: DC

## 2017-06-09 ENCOUNTER — Ambulatory Visit: Payer: Medicare Other | Admitting: "Endocrinology

## 2017-06-11 ENCOUNTER — Other Ambulatory Visit: Payer: Self-pay | Admitting: Adult Health

## 2017-06-11 DIAGNOSIS — Z1231 Encounter for screening mammogram for malignant neoplasm of breast: Secondary | ICD-10-CM

## 2017-06-16 ENCOUNTER — Encounter: Payer: Self-pay | Admitting: "Endocrinology

## 2017-06-16 ENCOUNTER — Ambulatory Visit (INDEPENDENT_AMBULATORY_CARE_PROVIDER_SITE_OTHER): Payer: Medicare Other | Admitting: "Endocrinology

## 2017-06-16 VITALS — BP 134/72 | HR 94 | Ht 68.0 in | Wt 177.0 lb

## 2017-06-16 DIAGNOSIS — E890 Postprocedural hypothyroidism: Secondary | ICD-10-CM | POA: Diagnosis not present

## 2017-06-16 DIAGNOSIS — C73 Malignant neoplasm of thyroid gland: Secondary | ICD-10-CM

## 2017-06-16 NOTE — Progress Notes (Signed)
Subjective:    Patient ID: Joyce Martin, female    DOB: 1950-08-12, PCP Lemmie Evens, MD   Past Medical History:  Diagnosis Date  . Elevated BP 07/26/2014  . GERD (gastroesophageal reflux disease)   . Migraines   . 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 COLONOSCOPY N/A 04/18/2016   Procedure: COLONOSCOPY;  Surgeon: Danie Binder, MD;  [facility];  Service: Endoscopy;  Laterality: N/A;  9:30 AM  . THYROIDECTOMY N/A 02/10/2017   Procedure: THYROIDECTOMY;  Surgeon: Aviva Signs, MD;  Location: [facility];  Service: General;  Laterality: N/A;  . TUBAL LIGATION     Social History   Socioeconomic History  . Marital status: Divorced    Spouse name: None  . Number of children: None  . Years of education: None  . Highest education level: None  Social Needs  . Financial resource strain: None  . Food insecurity - worry: None  . Food insecurity - inability: None  . Transportation needs - medical: None  . Transportation needs - non-medical: None  Occupational History  . None  Tobacco Use  . Smoking status: Former Smoker    Packs/day: 0.25    Years: 5.00    Pack years: 1.25    Types: Cigarettes    Last attempt to quit: 01/30/1979    Years since quitting: 38.4  . Smokeless tobacco: Never Used  Substance and Sexual Activity  . Alcohol use: No  . Drug use: No  . Sexual activity: Yes    Birth control/protection: Post-menopausal  Other Topics Concern  . None  Social History Narrative  . None   Outpatient Encounter Medications as of 06/16/2017  Medication Sig  . Biotin w/ Vitamins C & E (HAIR SKIN & NAILS GUMMIES PO) Take 1 each by mouth 2 (two) times daily.  . cromolyn (OPTICROM) 4 % ophthalmic solution Place 1 drop into both eyes every other day.  . ibuprofen (ADVIL,MOTRIN) 200 MG tablet Take 400 mg by mouth daily as needed for headache.   . levothyroxine (SYNTHROID, LEVOTHROID)  100 MCG tablet Take 1 tablet (100 mcg total) by mouth daily before breakfast.  . pantoprazole (PROTONIX) 40 MG tablet Take 1 tablet (40 mg total) by mouth daily. (Patient not taking: Reported on 01/31/2017)  . traMADol (ULTRAM) 50 MG tablet Take 1 tablet (50 mg total) by mouth every 6 (six) hours as needed (mild pain).   No facility-administered encounter medications on file as of 06/16/2017.    ALLERGIES: Allergies  Allergen Reactions  . Aspirin Nausea Only  . Other Other (See Comments)    COMBID: for stomach -twisting pt's face     VACCINATION STATUS: Immunization History  Administered Date(s) Administered  . Tdap 02/28/2011    HPI  66 year old female patient with medical history as above. She is being seen in  Follow-up for toxic nodule on the left lower her thyroid . - She was found to have abnormal fine-needle aspiration of thyroid nodule. She underwent total thyroidectomy on 02/10/2017 which revealed papillary thyroid cancer follicular variant. - She was sent for I-131 thyroid ablation with Thyrogen stimulation followed by whole body scan which revealed some thyroid remnant in the neck with no evidence of distant metastasis on 05/05/2017. - She is currently on levothyroxine 100 g by mouth every morning. She reports compliance with his medication. - She denies dysphagia, shortness of breath, voice change. -  She has steady body weight over the last several months.  - She denies palpitations, heat/cold intolerance, and tremors.  Review of Systems  Constitutional: + steady weight,  no fatigue, no subjective hyperthermia, no subjective hypothermia Eyes: no blurry vision, no xerophthalmia ENT: no sore throat, no nodules palpated in throat, no dysphagia/odynophagia, no hoarseness Cardiovascular: no Chest Pain, no Shortness of Breath, no palpitations, no leg swelling Respiratory: no cough, no SOB Gastrointestinal: no Nausea/Vomiting/Diarhhea Musculoskeletal: no muscle/joint  aches Skin: no rashes Neurological: no tremors, no numbness, no tingling, no dizziness Psychiatric: no depression, no anxiety  Objective:    BP 134/72   Pulse 94   Ht 5\' 8"  (1.727 m)   Wt 177 lb (80.3 kg)   BMI 26.91 kg/m   Wt Readings from Last 3 Encounters:  06/16/17 177 lb (80.3 kg)  04/08/17 178 lb (80.7 kg)  02/18/17 173 lb (78.5 kg)    Physical Exam  Constitutional: Slightly over weight for height, not in acute distress, normal state of mind Eyes: PERRLA, EOMI, no exophthalmos ENT: moist mucous membranes, + Healing post thyroidectomy scar on anterior lower neck, no cervical lymphadenopathy Cardiovascular: normal precordial activity, Regular Rate and Rhythm, no Murmur/Rubs/Gallops Respiratory:  adequate breathing efforts, no gross chest deformity, Clear to auscultation bilaterally Gastrointestinal: abdomen soft, Non -tender, No distension, Bowel Sounds present Musculoskeletal: no gross deformities, strength intact in all four extremities Skin: moist, warm, no rashes Neurological:  + mild tremor with outstretched hands, Deep tendon reflexes normal in all four extremities.   1)  - Dedicated thyroid ultrasound showed 2.3 cm nodule on the left lobe and 2 nodules on the isthmus (0.8 cm and 0.7 cm.)  2)  Fine needle aspiration  Diagnosis on 01/09/2017 showed: THYROID, FINE NEEDLE ASPIRATION, LEFT (SPECIMEN 1 OF 1 COLLECTED 71/12/18) ATYPIA OF UNDETERMINED SIGNIFICANCE OR FOLLICULAR LESION OF UNDETERMINED SIGNIFICANCE (BETHESDA CATEGORY III).  3) Thyroid, thyroidectomy- 02/10/2017. 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 - TUMOR CONFINED WITHIN RENAL CAPSULE. - SEPARATE ADENOMATOUS NODULES. - BENIGN PARATHYROID GLAND. 4) post therapy for body scan from 05/05/2017 showed  Thyroid remnant.  No scintigraphic evidence of iodine-avid metastatic thyroid cancer.  Recent Results (from the past 2160 hour(s))  T4, free     Status: None   Collection Time:  03/26/17  9:28 AM  Result Value Ref Range   Free T4 1.2 0.8 - 1.8 ng/dL  TSH     Status: None   Collection Time: 03/26/17  9:28 AM  Result Value Ref Range   TSH 1.98 0.40 - 4.50 mIU/L  Thyroglobulin     Status: None   Collection Time: 05/05/17 10:02 AM  Result Value Ref Range   Thyroglobulin 37 ng/mL    Comment: (NOTE) Reference Range: Pubertal Children and Adults: <40 According to the [facility] of Clinical Biochemistry, the reference interval for Thyroglobulin (TG) should be related to euthyroid patients and not for patients who underwent thyroidectomy.  TG reference intervals for these patients depend on the residual mass of the thyroid tissue left after surgery.  Establishing a post-operative baseline is recommended.  The assay quantitation limit is 2.0 ng/mL. 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   Stimulated thyroglobulin level on 05/05/2017 was 37 with negative antithyroglobulin antibodies  -   Due to the fact that it is follicular variant and her relative youth, she was given adjuvant   thyroid remnant ablation utilizing I-131 with post therapy whole-body scan. This showed some thyroid remnant in the neck with no evidence of distant iodine avid metastasis.  -  Her stimulated thyroglobulin levels least detectable at 37. This warrants continuous surveillance for tumor recurrence. - she will return in 6 month with repeat thyroid function tests and thyroid/neck ultrasound.   2  Post thyroidectomy Hypothyroidism - Her recent thyroid function tests are consistent with appropriate replacement. -  I advised her to continue levothyroxine 100 g by mouth every morning for now.  - We discussed about correct intake of levothyroxine, at fasting, with water, separated by at least 30 minutes from breakfast, and separated by more than 4 hours from calcium, iron, multivitamins, acid reflux medications (PPIs). -Patient is made aware of the fact that thyroid hormone replacement is needed for life, dose to be adjusted by periodic monitoring of thyroid function tests.  - I advised patient to maintain close follow up with Lemmie Evens, MD for primary care needs. Follow up plan: Return in about 6 months (around 12/15/2017) for follow up with pre-visit labs.  Glade Lloyd, MD Phone: [phone]  Fax: [phone]  -  This note was partially dictated with voice recognition software. Similar sounding words can be transcribed inadequately or may not  be corrected upon review.  06/16/2017, 2:50 PM

## 2017-06-19 ENCOUNTER — Ambulatory Visit (HOSPITAL_COMMUNITY): Payer: Medicare Other

## 2017-06-19 ENCOUNTER — Ambulatory Visit (HOSPITAL_COMMUNITY)
Admission: RE | Admit: 2017-06-19 | Discharge: 2017-06-19 | Disposition: A | Discharge status: Home / Self Care | Payer: Medicare Other | Source: Ambulatory Visit | Attending: Adult Health | Admitting: Adult Health

## 2017-06-19 DIAGNOSIS — Z1231 Encounter for screening mammogram for malignant neoplasm of breast: Secondary | ICD-10-CM | POA: Diagnosis not present

## 2017-08-12 DIAGNOSIS — M25512 Pain in left shoulder: Secondary | ICD-10-CM | POA: Insufficient documentation

## 2017-08-21 ENCOUNTER — Other Ambulatory Visit (HOSPITAL_COMMUNITY)
Admission: RE | Admit: 2017-08-21 | Discharge: 2017-08-21 | Disposition: A | Discharge status: Home / Self Care | Payer: Medicare Other | Source: Ambulatory Visit | Attending: Adult Health | Admitting: Adult Health

## 2017-08-21 ENCOUNTER — Encounter (INDEPENDENT_AMBULATORY_CARE_PROVIDER_SITE_OTHER): Payer: Self-pay

## 2017-08-21 ENCOUNTER — Other Ambulatory Visit: Payer: Self-pay

## 2017-08-21 ENCOUNTER — Ambulatory Visit: Payer: Medicare Other | Admitting: Adult Health

## 2017-08-21 ENCOUNTER — Encounter: Payer: Self-pay | Admitting: Adult Health

## 2017-08-21 VITALS — BP 120/82 | HR 95 | Resp 16 | Ht 68.0 in | Wt 179.0 lb

## 2017-08-21 DIAGNOSIS — Z124 Encounter for screening for malignant neoplasm of cervix: Secondary | ICD-10-CM

## 2017-08-21 DIAGNOSIS — Z85850 Personal history of malignant neoplasm of thyroid: Secondary | ICD-10-CM | POA: Diagnosis not present

## 2017-08-21 DIAGNOSIS — Z1211 Encounter for screening for malignant neoplasm of colon: Secondary | ICD-10-CM

## 2017-08-21 DIAGNOSIS — Z01419 Encounter for gynecological examination (general) (routine) without abnormal findings: Secondary | ICD-10-CM

## 2017-08-21 DIAGNOSIS — Z1212 Encounter for screening for malignant neoplasm of rectum: Secondary | ICD-10-CM

## 2017-08-21 DIAGNOSIS — Z01411 Encounter for gynecological examination (general) (routine) with abnormal findings: Secondary | ICD-10-CM

## 2017-08-21 LAB — HEMOCCULT GUIAC POC 1CARD (OFFICE): Fecal Occult Blood, POC: NEGATIVE

## 2017-08-21 NOTE — Progress Notes (Signed)
Patient ID: Joyce Martin, female   DOB: 21-Nov-1950, 67 y.o.   MRN: [id number] History of Present Illness:  Joyce Martin is a 67 year old black female, single in for well woman gyn exam and pap.Has thyroid removed last year, papillary cancer. PCP is [employer].   Current Medications, Allergies, Past Medical History, Past Surgical History, Family History and Social History were reviewed in [employer] record.     Review of Systems: Patient denies any headaches, hearing loss, fatigue, blurred vision, shortness of breath, chest pain, abdominal pain, problems with bowel movements, urination, or intercourse. No joint pain or mood swings. She goes to [employer].    Physical Exam:BP 120/82 (BP Location: Right Arm, Patient Position: Sitting, Cuff Size: Normal)   Pulse 95   Resp 16   Ht 5\' 8"  (1.727 m)   Wt 179 lb (81.2 kg)   BMI 27.22 kg/m  General:  Well developed, well nourished, no acute distress Skin:  Warm and dry Neck:  Midline trachea, thyroid surgically absent, good ROM, no lymphadenopathy,no carotid bruits heard Lungs; Clear to auscultation bilaterally Breast:  No dominant palpable mass, retraction, or nipple discharge Cardiovascular: Regular rate and rhythm Abdomen:  Soft, non tender, no hepatosplenomegaly Pelvic:  External genitalia is normal in appearance, no lesions.  The vagina is pink with fairly good moisture. Urethra has no lesions or masses. The cervix is smooth, pap with HPV performed.  Uterus is felt to be normal size, shape, and contour.  No adnexal masses or tenderness noted.Bladder is non tender, no masses felt. Rectal: Good sphincter tone, no polyps, or hemorrhoids felt.  Hemoccult negative. Extremities/musculoskeletal:  No swelling or varicosities noted, no clubbing or cyanosis Psych:  No mood changes, alert and cooperative,seems happy PHQ 2 score 0.  Impression:  1. Encounter for gynecological examination with Papanicolaou smear of cervix   2.  Routine cervical smear   3. Screening for colorectal cancer      Plan: Physical in 2 years Can stop paps at 67 if desires  Mammogram yearly Labs with PCP Colonoscopy per GI

## 2017-08-22 LAB — CYTOLOGY - PAP
ADEQUACY: ABSENT
Diagnosis: NEGATIVE
HPV: NOT DETECTED

## 2017-10-09 ENCOUNTER — Telehealth: Payer: Self-pay | Admitting: "Endocrinology

## 2017-10-09 MED ORDER — LEVOTHYROXINE SODIUM 100 MCG PO TABS: 100.0000 ug | ORAL_TABLET | Freq: Every day | ORAL | 1 refills | Status: DC

## 2017-10-09 NOTE — Telephone Encounter (Signed)
Joyce Martin is asking for a refill on levothyroxine (SYNTHROID, LEVOTHROID) 100 MCG tablet  Sent to [facility]

## 2017-10-13 ENCOUNTER — Other Ambulatory Visit: Payer: Self-pay

## 2017-10-13 ENCOUNTER — Other Ambulatory Visit: Payer: Self-pay | Admitting: "Endocrinology

## 2017-10-13 MED ORDER — LEVOTHYROXINE SODIUM 100 MCG PO TABS: 100.0000 ug | ORAL_TABLET | Freq: Every day | ORAL | 1 refills | Status: DC

## 2017-10-22 ENCOUNTER — Telehealth: Payer: Self-pay | Admitting: *Deleted

## 2017-11-10 NOTE — Telephone Encounter (Signed)
Entered in error

## 2017-12-15 ENCOUNTER — Ambulatory Visit: Payer: Medicare Other | Admitting: "Endocrinology

## 2017-12-15 ENCOUNTER — Other Ambulatory Visit: Payer: Self-pay | Admitting: "Endocrinology

## 2017-12-15 ENCOUNTER — Other Ambulatory Visit: Payer: Self-pay

## 2017-12-15 DIAGNOSIS — E039 Hypothyroidism, unspecified: Secondary | ICD-10-CM

## 2017-12-16 LAB — T4, FREE: FREE T4: 1.6 ng/dL (ref 0.8–1.8)

## 2017-12-16 LAB — TSH: TSH: 0.17 mIU/L — ABNORMAL LOW (ref 0.40–4.50)

## 2017-12-16 LAB — THYROGLOBULIN LEVEL

## 2017-12-22 ENCOUNTER — Encounter: Payer: Self-pay | Admitting: "Endocrinology

## 2017-12-22 ENCOUNTER — Ambulatory Visit (INDEPENDENT_AMBULATORY_CARE_PROVIDER_SITE_OTHER): Payer: Medicare Other | Admitting: "Endocrinology

## 2017-12-22 VITALS — BP 121/85 | HR 82 | Ht 68.0 in | Wt 174.0 lb

## 2017-12-22 DIAGNOSIS — E890 Postprocedural hypothyroidism: Secondary | ICD-10-CM | POA: Diagnosis not present

## 2017-12-22 DIAGNOSIS — C73 Malignant neoplasm of thyroid gland: Secondary | ICD-10-CM | POA: Diagnosis not present

## 2017-12-22 MED ORDER — LEVOTHYROXINE SODIUM 100 MCG PO TABS: ORAL_TABLET | ORAL | 2 refills | Status: DC

## 2017-12-22 NOTE — Progress Notes (Signed)
Subjective:    Patient ID: Joyce Martin, female    DOB: [birth year], PCP Lemmie Evens, MD   Past Medical History:  Diagnosis Date  . Elevated BP 07/26/2014  . GERD (gastroesophageal reflux disease)   . Migraines   . 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 Thyroid cancer ([place]) 11/2016   Past Surgical History:  Procedure Laterality Date  . COLONOSCOPY N/A 04/18/2016   Procedure: COLONOSCOPY;  Surgeon: Danie Binder, MD;  Location: [facility];  Service: Endoscopy;  Laterality: N/A;  9:30 AM  . THYROIDECTOMY N/A 02/10/2017   Procedure: THYROIDECTOMY;  Surgeon: Aviva Signs, MD;  Location: [facility];  Service: General;  Laterality: N/A;  . TUBAL LIGATION     Social History   Socioeconomic History  . Marital status: Divorced    Spouse name: Not on file  . Number of children: Not on file  . Years of education: Not on file  . Highest education level: Not on file  Occupational History  . Not on file  Social Needs  . Financial resource strain: Not on file  . Food insecurity:    Worry: Not on file    Inability: Not on file  . Transportation needs:    Medical: Not on file    Non-medical: Not on file  Tobacco Use  . Smoking status: Former Smoker    Packs/day: 0.25    Years: 5.00    Pack years: 1.25    Types: Cigarettes    Last attempt to quit: 01/30/1979    Years since quitting: 38.9  . Smokeless tobacco: Never Used  Substance and Sexual Activity  . Alcohol use: No  . Drug use: No  . Sexual activity: Yes    Birth control/protection: Post-menopausal  Lifestyle  . Physical activity:    Days per week: Not on file    Minutes per session: Not on file  . Stress: Not on file  Relationships  . Social connections:    Talks on phone: Not on file    Gets together: Not on file    Attends religious service: Not on file    Active member of club or organization: Not on file    Attends meetings of clubs or organizations: Not on file    Relationship  status: Not on file  Other Topics Concern  . Not on file  Social History Narrative  . Not on file   Outpatient Encounter Medications as of 12/22/2017  Medication Sig  . Biotin w/ Vitamins C & E (HAIR SKIN & NAILS GUMMIES PO) Take 1 each by mouth 2 (two) times daily.  . cromolyn (OPTICROM) 4 % ophthalmic solution Place 1 drop into both eyes every other day.  . ibuprofen (ADVIL,MOTRIN) 200 MG tablet Take 400 mg by mouth daily as needed for headache.   . levothyroxine (SYNTHROID, LEVOTHROID) 100 MCG tablet TAKE 1 TABLET BY MOUTH ONCE DAILY BEFORE BREAKFAST  . pantoprazole (PROTONIX) 40 MG tablet Take 1 tablet (40 mg total) by mouth daily.  . traMADol (ULTRAM) 50 MG tablet Take 1 tablet (50 mg total) by mouth every 6 (six) hours as needed (mild pain).  . [DISCONTINUED] levothyroxine (SYNTHROID, LEVOTHROID) 100 MCG tablet TAKE 1 TABLET BY MOUTH ONCE DAILY BEFORE BREAKFAST   No facility-administered encounter medications on file as of 12/22/2017.    ALLERGIES: Allergies  Allergen Reactions  . Aspirin Nausea Only  . Other Other (See Comments)    COMBID: for  stomach -twisting pt's face     VACCINATION STATUS: Immunization History  Administered Date(s) Administered  . Tdap 02/28/2011    HPI  67 year old female patient with medical history as above.  She is returning for follow-up of postsurgical hypothyroidism, history of papillary thyroid cancer.   -Her history starts in 2018 when she was found to have abnormal fine-needle aspiration of thyroid nodule. She underwent total thyroidectomy on 02/10/2017 which revealed papillary thyroid cancer follicular variant. -Subsequently, she underwent  I-131 thyroid ablation with Thyrogen stimulation followed by whole body scan which revealed some thyroid remnant in the neck with no evidence of distant metastasis on 05/05/2017. - She is currently on levothyroxine 100 g by mouth every morning. She reports compliance with her medication. - She denies  dysphagia, shortness of breath, voice change. - She has steady body weight over the last several months.  - She denies palpitations, heat/cold intolerance, and tremors.  Review of Systems  Constitutional: + steady weight,  no fatigue, no subjective hyperthermia, no subjective hypothermia Eyes: no blurry vision, no xerophthalmia ENT: no sore throat, no nodules palpated in throat, no dysphagia/odynophagia, no hoarseness Cardiovascular: no Chest Pain, no Shortness of Breath, no palpitations, no leg swelling Respiratory: no cough, no SOB Gastrointestinal: no Nausea/Vomiting/Diarhhea Musculoskeletal: no muscle/joint aches, +complains of neck stiffness. Skin: no rashes Neurological: no tremors, no numbness, no tingling, no dizziness Psychiatric: no depression, no anxiety  Objective:    BP 121/85   Pulse 82   Ht 5\' 8"  (1.727 m)   Wt 174 lb (78.9 kg)   BMI 26.46 kg/m   Wt Readings from Last 3 Encounters:  12/22/17 174 lb (78.9 kg)  08/21/17 179 lb (81.2 kg)  06/16/17 177 lb (80.3 kg)    Physical Exam  Constitutional: Slightly over weight for height, not in acute distress, normal state of mind Eyes: PERRLA, EOMI, no exophthalmos ENT: moist mucous membranes, + healed thyroidectomy scar on anterior lower neck with no gross mass or thyroid enlargement.  Musculoskeletal: no gross deformities, strength intact in all four extremities Skin: moist, warm, no rashes Neurological:  + mild tremor with outstretched hands, Deep tendon reflexes normal in all four extremities.   1)  - Dedicated thyroid ultrasound showed 2.3 cm nodule on the left lobe and 2 nodules on the isthmus (0.8 cm and 0.7 cm.)  2)  Fine needle aspiration  Diagnosis on 01/09/2017 showed: THYROID, FINE NEEDLE ASPIRATION, LEFT (SPECIMEN 1 OF 1 COLLECTED 71/12/18) ATYPIA OF UNDETERMINED SIGNIFICANCE OR FOLLICULAR LESION OF UNDETERMINED SIGNIFICANCE (BETHESDA CATEGORY III).  3) Thyroid, thyroidectomy- 02/10/2017. 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 - TUMOR CONFINED WITHIN RENAL CAPSULE. - SEPARATE ADENOMATOUS NODULES. - BENIGN PARATHYROID GLAND. 4) post therapy for body scan from 05/05/2017 showed  Thyroid remnant.  No scintigraphic evidence of iodine-avid metastatic thyroid cancer.  Recent Results (from the past 2160 hour(s))  TSH     Status: Abnormal   Collection Time: 12/15/17  2:52 PM  Result Value Ref Range   TSH 0.17 (L) 0.40 - 4.50 mIU/L  T4, free     Status: None   Collection Time: 12/15/17  2:52 PM  Result Value Ref Range   Free T4 1.6 0.8 - 1.8 ng/dL  Thyroglobulin Level     Status: Abnormal   Collection Time: 12/15/17  2:52 PM  Result Value Ref Range   Thyroglobulin <0.1 (L) ng/mL    Comment:       Reference Range:       Intact  Thyroid   2.8-40.9       Athyrotic        <0.1 .       Note: Abnormal flagging is based       on the reference interval for        patients with intact thyroid. . . This test was performed using the [employer] Coulter  chemiluminescent method. Values obtained from different assay methods cannot be used interchangeably. Thyroglobulin levels, regardless of value, should not be interpreted as absolute evidence of the presence or absence of disease. .    Comment      Comment: . Thyroglobulin antibodies (TGAB) interfere with thyroglobulin (TG) assays; therefore, TGAB assay should always be performed in conjunction with a TG assay. .     Assessment & Plan:   1. Follicular variant of Papillary Thyroid Cancer - Her 02/10/2017 sub total thyroidectomy revealed 0.4 cm unifocal , circumscribed, follicular variant of papillary thyroid cancer.TNM code: pT1a, pNX  I had a long discussion with the patient about her recent diagnosis of follicular variant papillary thyroid cancer:  reviewed the pathology  underlined multifocality   Underlined that it was encapsulated  did not spread to the lymph vessels  discussed prognosis  discussed  further treatment with RAI  TSH goals (0.1-0.5).   Stimulated thyroglobulin level on 05/05/2017 was 37 with negative antithyroglobulin antibodies  -  Due to the fact that it is follicular variant and her relative youth, she was given adjuvant   thyroid remnant ablation utilizing I-131 with post therapy whole-body scan. This showed some thyroid remnant in the neck with no evidence of distant iodine avid metastasis.  -  Her stimulated thyroglobulin levels least detectable at 37. This warrants continuous surveillance for tumor recurrence. -Her most recent unstimulated thyroglobulin level was undetectable at less than 0.1. -She will be scheduled to have surveillance thyroid/neck ultrasound before her next visit in 6 months.   2  Post thyroidectomy Hypothyroidism -Her recent thyroid function tests are consistent with appropriate suppressive thyroid hormone replacement .  -  I advised her to continue levothyroxine 100 g by mouth every morning for now.  - We discussed about correct intake of levothyroxine, at fasting, with water, separated by at least 30 minutes from breakfast, and separated by more than 4 hours from calcium, iron, multivitamins, acid reflux medications (PPIs). -Patient is made aware of the fact that thyroid hormone replacement is needed for life, dose to be adjusted by periodic monitoring of thyroid function tests.  - I advised patient to maintain close follow up with Lemmie Evens, MD for primary care needs. Follow up plan: Return in about 6 months (around 06/23/2018) for Thyroid / Neck Ultrasound, follow up with pre-visit labs.  Glade Lloyd, MD Phone: [phone]  Fax: [phone]  -  This note was partially dictated with voice recognition software. Similar sounding words can be transcribed inadequately or may not  be corrected upon review.  12/22/2017, 5:20 PM

## 2018-05-19 ENCOUNTER — Other Ambulatory Visit: Payer: Self-pay | Admitting: Adult Health

## 2018-05-19 DIAGNOSIS — Z1231 Encounter for screening mammogram for malignant neoplasm of breast: Secondary | ICD-10-CM

## 2018-06-25 ENCOUNTER — Ambulatory Visit (HOSPITAL_COMMUNITY)
Admission: RE | Admit: 2018-06-25 | Discharge: 2018-06-25 | Disposition: A | Discharge status: Home / Self Care | Payer: Medicare Other | Source: Ambulatory Visit | Attending: Adult Health | Admitting: Adult Health

## 2018-06-25 DIAGNOSIS — Z1231 Encounter for screening mammogram for malignant neoplasm of breast: Secondary | ICD-10-CM

## 2018-06-29 ENCOUNTER — Ambulatory Visit: Payer: Medicare Other | Admitting: "Endocrinology

## 2018-06-29 ENCOUNTER — Other Ambulatory Visit: Payer: Self-pay

## 2018-06-29 ENCOUNTER — Other Ambulatory Visit: Payer: Self-pay | Admitting: "Endocrinology

## 2018-06-29 DIAGNOSIS — E890 Postprocedural hypothyroidism: Secondary | ICD-10-CM

## 2018-06-30 LAB — TSH: TSH: 0.1 mIU/L — ABNORMAL LOW (ref 0.40–4.50)

## 2018-06-30 LAB — T4, FREE: FREE T4: 1.5 ng/dL (ref 0.8–1.8)

## 2018-07-23 ENCOUNTER — Ambulatory Visit (INDEPENDENT_AMBULATORY_CARE_PROVIDER_SITE_OTHER): Payer: Medicare Other | Admitting: "Endocrinology

## 2018-07-23 ENCOUNTER — Encounter: Payer: Self-pay | Admitting: "Endocrinology

## 2018-07-23 VITALS — BP 134/87 | HR 88 | Ht 68.0 in | Wt 174.0 lb

## 2018-07-23 DIAGNOSIS — E890 Postprocedural hypothyroidism: Secondary | ICD-10-CM | POA: Diagnosis not present

## 2018-07-23 DIAGNOSIS — C73 Malignant neoplasm of thyroid gland: Secondary | ICD-10-CM

## 2018-07-23 MED ORDER — LEVOTHYROXINE SODIUM 100 MCG PO TABS: ORAL_TABLET | ORAL | 2 refills | Status: DC

## 2018-07-23 NOTE — Progress Notes (Signed)
Endocrinology follow-up note   Subjective:    Patient ID: Joyce Martin, female    DOB: 04-14-1951, PCP Lemmie Evens, MD   Past Medical History:  Diagnosis Date  . Elevated BP 07/26/2014  . GERD (gastroesophageal reflux disease)   . Migraines   . 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 Thyroid cancer ([place]) 11/2016   Past Surgical History:  Procedure Laterality Date  . COLONOSCOPY N/A 04/18/2016   Procedure: COLONOSCOPY;  Surgeon: Danie Binder, MD;  Location[facility];  Service: Endoscopy;  Laterality: N/A;  9:30 AM  . THYROIDECTOMY N/A 02/10/2017   Procedure: THYROIDECTOMY;  Surgeon: Aviva Signs, MD;  Location: [facility];  Service: General;  Laterality: N/A;  . TUBAL LIGATION     Social History   Socioeconomic History  . Marital status: Divorced    Spouse name: Not on file  . Number of children: Not on file  . Years of education: Not on file  . Highest education level: Not on file  Occupational History  . Not on file  Social Needs  . Financial resource strain: Not on file  . Food insecurity:    Worry: Not on file    Inability: Not on file  . Transportation needs:    Medical: Not on file    Non-medical: Not on file  Tobacco Use  . Smoking status: Former Smoker    Packs/day: 0.25    Years: 5.00    Pack years: 1.25    Types: Cigarettes    Last attempt to quit: 01/30/1979    Years since quitting: 39.5  . Smokeless tobacco: Never Used  Substance and Sexual Activity  . Alcohol use: No  . Drug use: No  . Sexual activity: Yes    Birth control/protection: Post-menopausal  Lifestyle  . Physical activity:    Days per week: Not on file    Minutes per session: Not on file  . Stress: Not on file  Relationships  . Social connections:    Talks on phone: Not on file    Gets together: Not on file    Attends religious service: Not on file    Active member of club or organization: Not on file    Attends meetings of clubs or organizations:  Not on file    Relationship status: Not on file  Other Topics Concern  . Not on file  Social History Narrative  . Not on file   Outpatient Encounter Medications as of 07/23/2018  Medication Sig  . Polyethyl Glycol-Propyl Glycol (SYSTANE ULTRA OP) Apply to eye.  . levothyroxine (SYNTHROID, LEVOTHROID) 100 MCG tablet TAKE 1 TABLET BY MOUTH ONCE DAILY BEFORE BREAKFAST  . [DISCONTINUED] Biotin w/ Vitamins C & E (HAIR SKIN & NAILS GUMMIES PO) Take 1 each by mouth 2 (two) times daily.  . [DISCONTINUED] cromolyn (OPTICROM) 4 % ophthalmic solution Place 1 drop into both eyes every other day.  . [DISCONTINUED] ibuprofen (ADVIL,MOTRIN) 200 MG tablet Take 400 mg by mouth daily as needed for headache.   . [DISCONTINUED] levothyroxine (SYNTHROID, LEVOTHROID) 100 MCG tablet TAKE 1 TABLET BY MOUTH ONCE DAILY BEFORE BREAKFAST  . [DISCONTINUED] pantoprazole (PROTONIX) 40 MG tablet Take 1 tablet (40 mg total) by mouth daily.  . [DISCONTINUED] traMADol (ULTRAM) 50 MG tablet Take 1 tablet (50 mg total) by mouth every 6 (six) hours as needed (mild pain).   No facility-administered encounter medications on file as of 07/23/2018.    ALLERGIES: Allergies  Allergen Reactions  . Aspirin Nausea Only  . Other Other (See Comments)    COMBID: for stomach -twisting pt's face     VACCINATION STATUS: Immunization History  Administered Date(s) Administered  . Tdap 02/28/2011    HPI  68 year old female patient with medical history as above.  She is returning for follow-up of postsurgical hypothyroidism, history of papillary thyroid cancer.   -Her history starts in 2018 when she was found to have abnormal fine-needle aspiration of thyroid nodule. She underwent total thyroidectomy on 02/10/2017 which revealed papillary thyroid cancer follicular variant. -Subsequently, she underwent  I-131 thyroid ablation with Thyrogen stimulation followed by whole body scan which revealed some thyroid remnant in the neck with no  evidence of distant metastasis on 05/05/2017.  She was supposed to have pre-visit thyroid/neck ultrasound , missed her appointment. - She is currently on levothyroxine 100 g by mouth every morning. She reports compliance with her medication. - She denies dysphagia, shortness of breath, voice change. - She has steady body weight over the last several months.  - She denies palpitations, heat/cold intolerance, and tremors.  Review of Systems  Constitutional: + steady weight,  no fatigue, no subjective hyperthermia, no subjective hypothermia Eyes: no blurry vision, no xerophthalmia ENT: no sore throat, no nodules palpated in throat, no dysphagia/odynophagia, no hoarseness Cardiovascular: no Chest Pain, no Shortness of Breath, no palpitations, no leg swelling Respiratory: no cough, no SOB Gastrointestinal: no Nausea/Vomiting/Diarhhea Musculoskeletal: no muscle/joint aches, +complains of neck stiffness. Skin: no rashes Neurological: no tremors, no numbness, no tingling, no dizziness Psychiatric: no depression, no anxiety  Objective:    BP 134/87   Pulse 88   Ht 5\' 8"  (1.727 m)   Wt 174 lb (78.9 kg)   BMI 26.46 kg/m   Wt Readings from Last 3 Encounters:  07/23/18 174 lb (78.9 kg)  12/22/17 174 lb (78.9 kg)  08/21/17 179 lb (81.2 kg)    Physical Exam  Constitutional: Slightly over weight for height, not in acute distress, normal state of mind Eyes: PERRLA, EOMI, no exophthalmos ENT: moist mucous membranes, + healed thyroidectomy scar on anterior lower neck with no gross mass or thyroid enlargement.  Musculoskeletal: no gross deformities, strength intact in all four extremities Skin: moist, warm, no rashes Neurological:  + mild tremor with outstretched hands, Deep tendon reflexes normal in all four extremities.   1)  - Dedicated thyroid ultrasound showed 2.3 cm nodule on the left lobe and 2 nodules on the isthmus (0.8 cm and 0.7 cm.)  2)  Fine needle aspiration  Diagnosis on  01/09/2017 showed: THYROID, FINE NEEDLE ASPIRATION, LEFT (SPECIMEN 1 OF 1 COLLECTED 71/12/18) ATYPIA OF UNDETERMINED SIGNIFICANCE OR FOLLICULAR LESION OF UNDETERMINED SIGNIFICANCE (BETHESDA CATEGORY III).  3) Thyroid, thyroidectomy- 02/10/2017. 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 - TUMOR CONFINED WITHIN RENAL CAPSULE. - SEPARATE ADENOMATOUS NODULES. - BENIGN PARATHYROID GLAND. 4) post therapy for body scan from 05/05/2017 showed  Thyroid remnant.  No scintigraphic evidence of iodine-avid metastatic thyroid cancer.  Recent Results (from the past 2160 hour(s))  TSH     Status: Abnormal   Collection Time: 06/29/18  2:03 PM  Result Value Ref Range   TSH 0.10 (L) 0.40 - 4.50 mIU/L  T4, Free     Status: None   Collection Time: 06/29/18  2:03 PM  Result Value Ref Range   Free T4 1.5 0.8 - 1.8 ng/dL    Assessment & Plan:   1. Follicular variant of Papillary Thyroid Cancer -  Her 02/10/2017 sub total thyroidectomy revealed 0.4 cm unifocal , circumscribed, follicular variant of papillary thyroid cancer.TNM code: pT1a, pNX  I had a long discussion with the patient about her recent diagnosis of follicular variant papillary thyroid cancer:  reviewed the pathology  underlined multifocality   Underlined that it was encapsulated  did not spread to the lymph vessels  discussed prognosis  discussed further treatment with RAI  TSH goals (0.1-0.5).   Stimulated thyroglobulin level on 05/05/2017 was 37 with negative antithyroglobulin antibodies  -  Due to the fact that it is follicular variant and her relative youth, she was given adjuvant   thyroid remnant ablation utilizing I-131 with post therapy whole-body scan. This showed some thyroid remnant in the neck with no evidence of distant iodine avid metastasis.  -  Her stimulated thyroglobulin levels least detectable at 37. This warrants continuous surveillance for tumor recurrence. -Her most recent unstimulated  thyroglobulin level was undetectable at less than 0.1. -She will be rescheduled to have surveillance thyroid/neck ultrasound anytime in the next several days.  2  Post thyroidectomy Hypothyroidism -Her recent thyroid function tests are consistent with appropriate suppressive thyroid hormone replacement .  - She is  advised to continue levothyroxine 100 g by mouth every morning for now.  - I advised patient to maintain close follow up with Lemmie Evens, MD for primary care needs.    Follow up plan: Return in about 6 months (around 01/21/2019) for Follow up with Pre-visit Labs, Thyroid / Neck Ultrasound.  Glade Lloyd, MD Phone: [phone]  Fax: [phone]  -  This note was partially dictated with voice recognition software. Similar sounding words can be transcribed inadequately or may not  be corrected upon review.  07/23/2018, 7:39 PM

## 2018-07-31 ENCOUNTER — Ambulatory Visit (HOSPITAL_COMMUNITY)
Admission: RE | Admit: 2018-07-31 | Discharge: 2018-07-31 | Disposition: A | Discharge status: Home / Self Care | Payer: Medicare Other | Source: Ambulatory Visit | Attending: "Endocrinology | Admitting: "Endocrinology

## 2018-07-31 DIAGNOSIS — C73 Malignant neoplasm of thyroid gland: Secondary | ICD-10-CM | POA: Insufficient documentation

## 2018-11-01 IMAGING — US US GUIDANCE NEEDLE PLACEMENT
1 series · 12 of 12 positions shown · non-contrast
Comparison: US thyroid

MEDICATIONS:
5 cc 1% lidocaine

COMPLICATIONS:
None immediate.

INDICATION: Indeterminate thyroid nodule

EXAM:
ULTRASOUND GUIDED FINE NEEDLE ASPIRATION OF INDETERMINATE THYROID
NODULE
TECHNIQUE: Informed written consent was obtained from the patient after a
discussion of the risks, benefits and alternatives to treatment.
Questions regarding the procedure were encouraged and answered. A
timeout was performed prior to the initiation of the procedure.

[Series 1: us guidance needle placement · 0.07mm/px · 12 acquisitions, 12 frames shown]
[im 1/12]
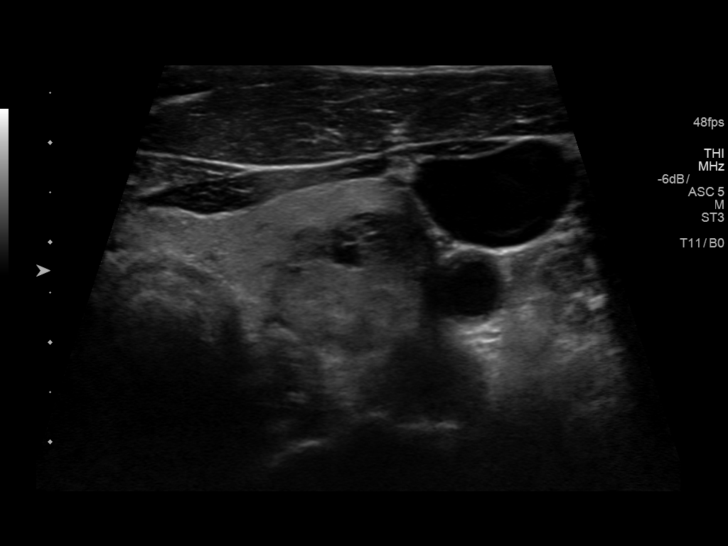
[im 2/12]
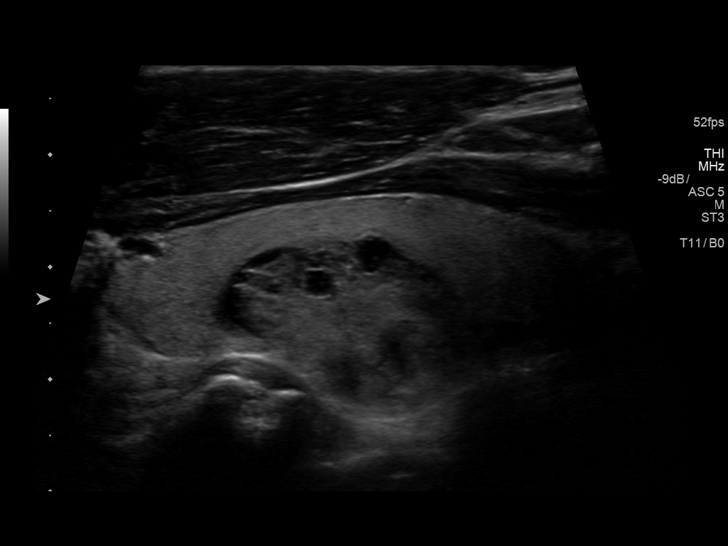
[im 3/12]
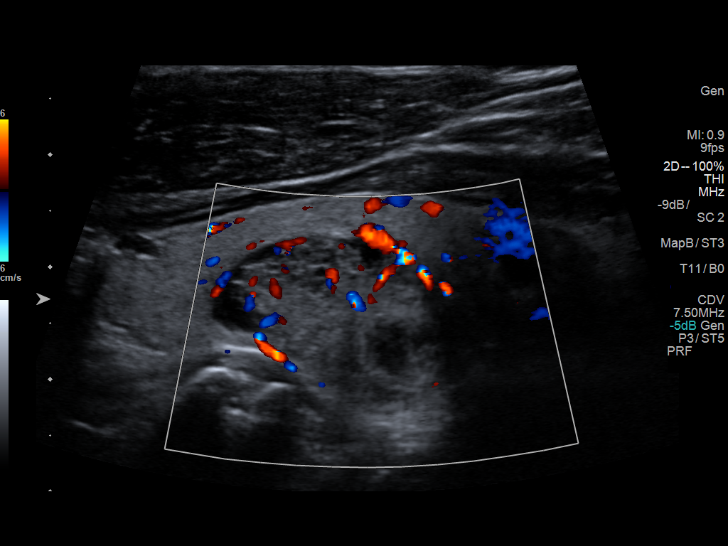
[im 4/12]
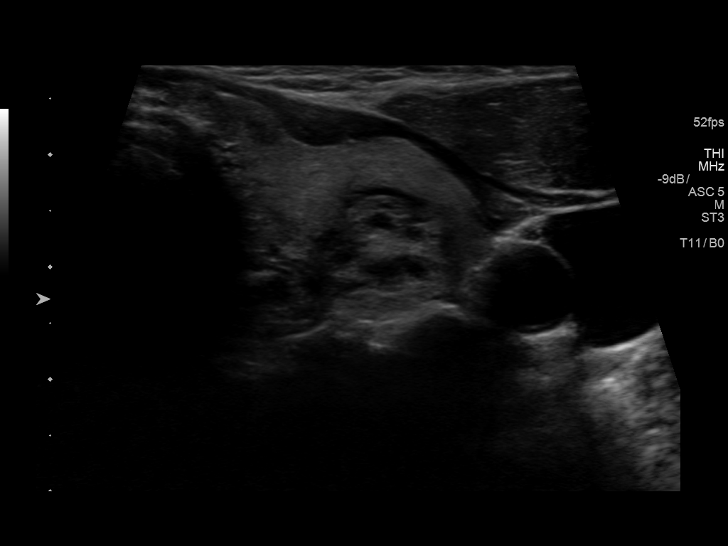
[im 5/12]
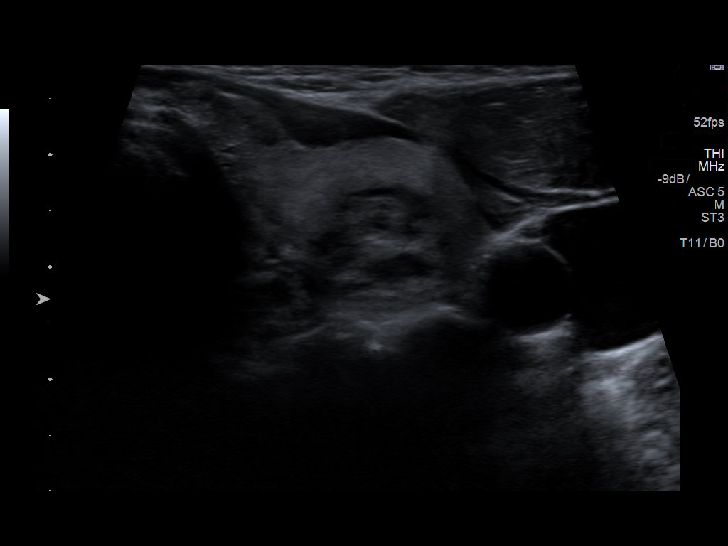
[im 6/12]
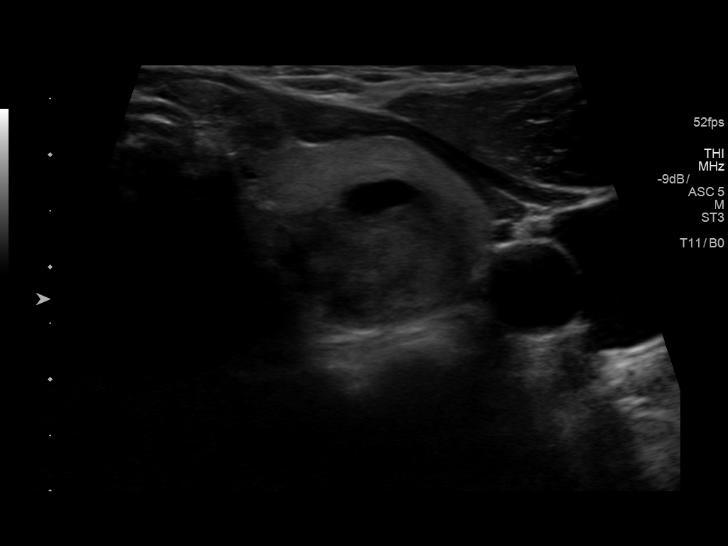
[im 7/12]
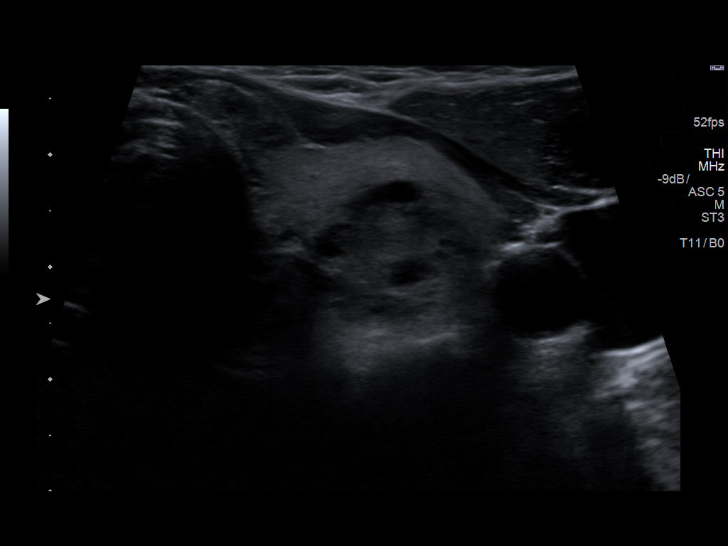
[im 8/12]
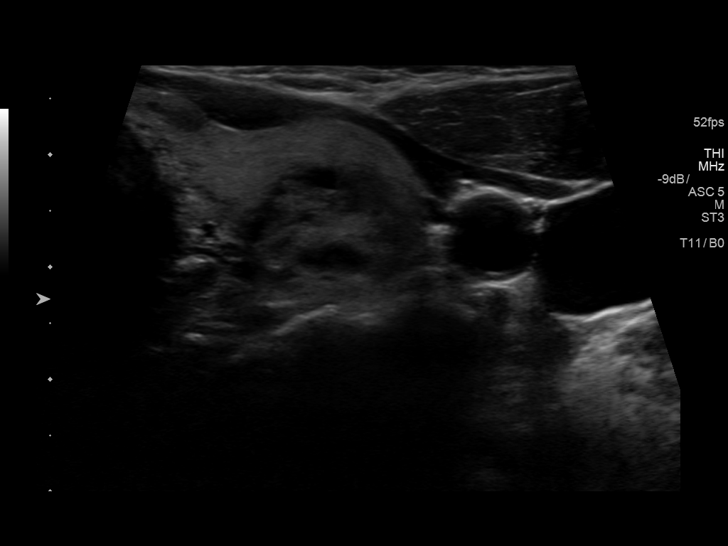
[im 9/12]
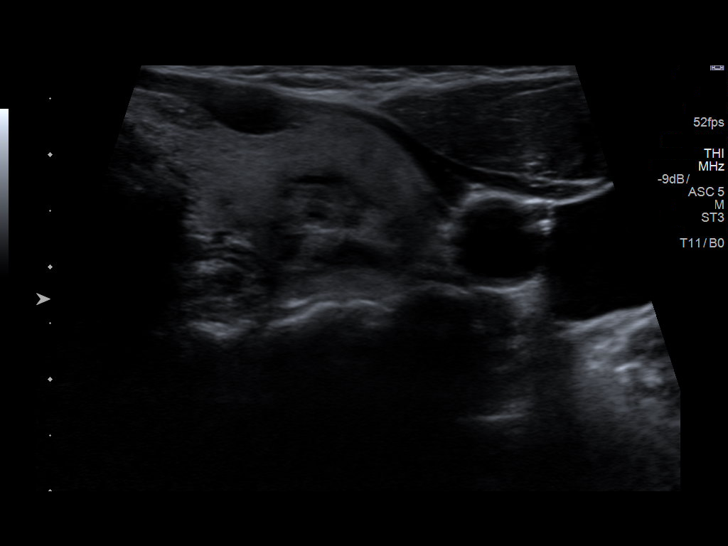
[im 10/12]
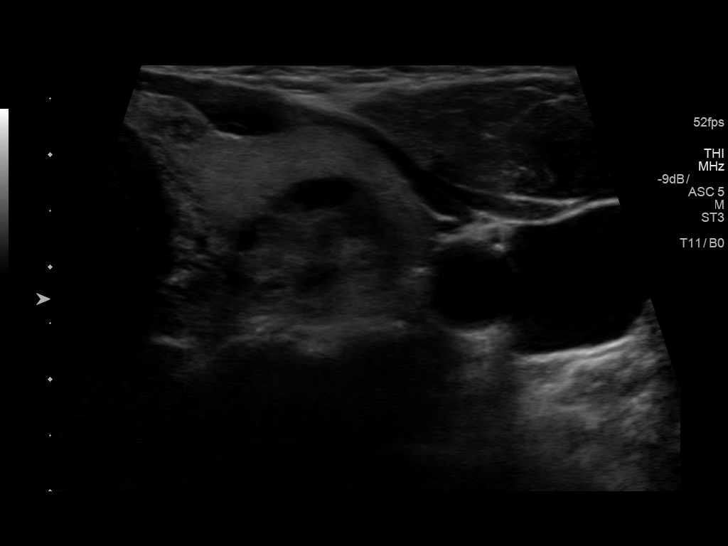
[im 11/12]
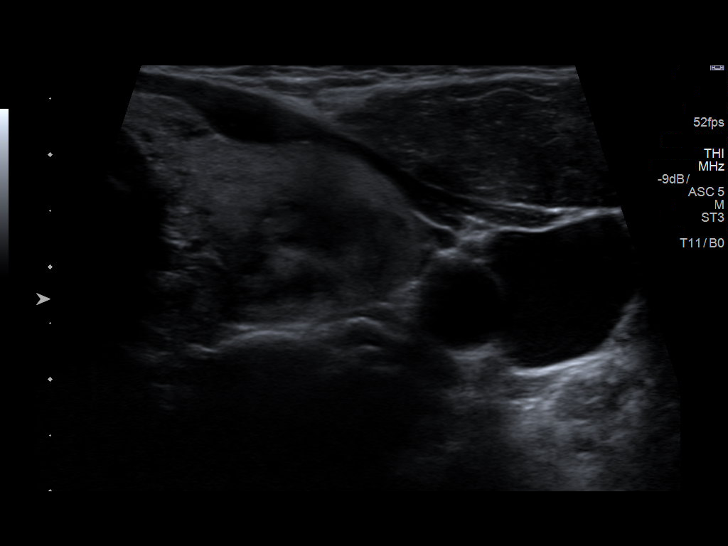
[im 12/12]
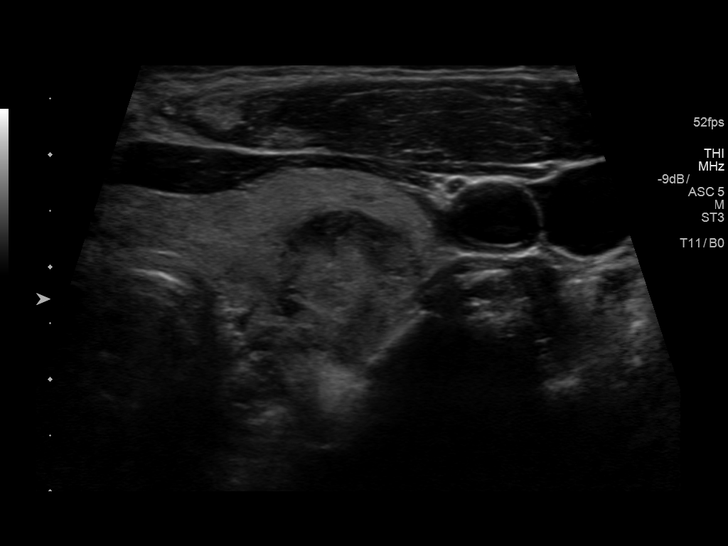

[12 of 12 positions shown; findings below may reference images not displayed]

Pre-procedural ultrasound scanning demonstrated unchanged size and
appearance of the indeterminate nodule within the left thyroid

The procedure was planned. The neck was prepped in the usual sterile
fashion, and a sterile drape was applied covering the operative
field. A timeout was performed prior to the initiation of the
procedure. Local anesthesia was provided with 1% lidocaine.

Under direct ultrasound guidance, 3 FNA biopsies were performed of
the left thyroid nodule with a 25 gauge needle. Multiple ultrasound
images were saved for procedural documentation purposes. The samples
were prepared and submitted to pathology.

Limited post procedural scanning was negative for hematoma or
additional complication. Dressings were placed. The patient
tolerated the above procedures procedure well without immediate
postprocedural complication.
FINDINGS: Nodule reference number based on prior diagnostic ultrasound: 1

Maximum size:

Location: Left; Mid

ACR TI-RADS risk category: TR4 (4-6 points)

Reason for biopsy: meets ACR TI-RADS criteria

Ultrasound imaging confirms appropriate placement of the needles
within the thyroid nodule.
IMPRESSION: Technically successful ultrasound guided fine needle aspiration of
left thyroid nodule

Read by

Banyana Neo Diarwa

## 2019-01-21 ENCOUNTER — Ambulatory Visit: Payer: Medicare Other | Admitting: "Endocrinology

## 2019-01-21 LAB — TSH: TSH: 0.08 mIU/L — ABNORMAL LOW (ref 0.40–4.50)

## 2019-01-21 LAB — T4, FREE: Free T4: 1.5 ng/dL (ref 0.8–1.8)

## 2019-01-26 ENCOUNTER — Ambulatory Visit (INDEPENDENT_AMBULATORY_CARE_PROVIDER_SITE_OTHER): Payer: Medicare Other | Admitting: "Endocrinology

## 2019-01-26 ENCOUNTER — Other Ambulatory Visit: Payer: Self-pay

## 2019-01-26 ENCOUNTER — Encounter: Payer: Self-pay | Admitting: "Endocrinology

## 2019-01-26 DIAGNOSIS — E890 Postprocedural hypothyroidism: Secondary | ICD-10-CM | POA: Diagnosis not present

## 2019-01-26 DIAGNOSIS — Z85850 Personal history of malignant neoplasm of thyroid: Secondary | ICD-10-CM

## 2019-01-26 MED ORDER — LEVOTHYROXINE SODIUM 100 MCG PO TABS: ORAL_TABLET | ORAL | 2 refills | Status: DC

## 2019-01-26 NOTE — Progress Notes (Signed)
01/26/2019                                Endocrinology Telehealth Visit Follow up Note -During COVID -19 Pandemic  I connected with Joyce Martin on 01/26/2019   by telephone and verified that I am speaking with the correct person using two identifiers. Joyce Martin, Jun 29, 1951. she has verbally consented to this visit. All issues noted in this document were discussed and addressed. The format was not optimal for physical exam   Subjective:    Patient ID: Joyce Martin, female    DOB: [birth year], PCP Lemmie Evens, MD   Past Medical History:  Diagnosis Date  . Elevated BP 07/26/2014  . GERD (gastroesophageal reflux disease)   . Migraines   . 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 Thyroid cancer ([place]) 11/2016   Past Surgical History:  Procedure Laterality Date  . COLONOSCOPY N/A 04/18/2016   Procedure: COLONOSCOPY;  Surgeon: Danie Binder, MD;  Location: [facility];  Service: Endoscopy;  Laterality: N/A;  9:30 AM  . THYROIDECTOMY N/A 02/10/2017   Procedure: THYROIDECTOMY;  Surgeon: Aviva Signs, MD;  Location: [facility];  Service: General;  Laterality: N/A;  . TUBAL LIGATION     Social History   Socioeconomic History  . Marital status: Divorced    Spouse name: Not on file  . Number of children: Not on file  . Years of education: Not on file  . Highest education level: Not on file  Occupational History  . Not on file  Social Needs  . Financial resource strain: Not on file  . Food insecurity    Worry: Not on file    Inability: Not on file  . Transportation needs    Medical: Not on file    Non-medical: Not on file  Tobacco Use  . Smoking status: Former Smoker    Packs/day: 0.25    Years: 5.00    Pack years: 1.25    Types: Cigarettes    Quit date: 01/30/1979    Years since quitting: 40.0  . Smokeless tobacco: Never Used  Substance and Sexual Activity  . Alcohol use: No  . Drug use: No  . Sexual activity: Yes    Birth  control/protection: Post-menopausal  Lifestyle  . Physical activity    Days per week: Not on file    Minutes per session: Not on file  . Stress: Not on file  Relationships  . Social Herbalist on phone: Not on file    Gets together: Not on file    Attends religious service: Not on file    Active member of club or organization: Not on file    Attends meetings of clubs or organizations: Not on file    Relationship status: Not on file  Other Topics Concern  . Not on file  Social History Narrative  . Not on file   Outpatient Encounter Medications as of 01/26/2019  Medication Sig  . levothyroxine (SYNTHROID) 100 MCG tablet TAKE 1 TABLET BY MOUTH ONCE DAILY BEFORE BREAKFAST  . Polyethyl Glycol-Propyl Glycol (SYSTANE ULTRA OP) Apply to eye.  . [DISCONTINUED] levothyroxine (SYNTHROID, LEVOTHROID) 100 MCG tablet TAKE 1 TABLET BY MOUTH ONCE DAILY BEFORE BREAKFAST   No facility-administered encounter medications on file as of 01/26/2019.    ALLERGIES: Allergies  Allergen Reactions  . Aspirin Nausea Only  . Other  Other (See Comments)    COMBID: for stomach -twisting pt's face     VACCINATION STATUS: Immunization History  Administered Date(s) Administered  . Tdap 02/28/2011    HPI  68 year old female patient with medical history as above.  She is returning for follow-up of postsurgical hypothyroidism, history of papillary thyroid cancer.   -Her history starts in 2018 when she was found to have abnormal fine-needle aspiration of thyroid nodule. She underwent total thyroidectomy on 02/10/2017 which revealed papillary thyroid cancer follicular variant. -Subsequently, she underwent  I-131 thyroid ablation with Thyrogen stimulation followed by whole body scan which revealed some thyroid remnant in the neck with no evidence of distant metastasis on 05/05/2017.  She was supposed to have pre-visit thyroid/neck ultrasound , missed her appointment. - She is currently on levothyroxine  100 g by mouth every morning.  She reports compliance to this medication.   - She denies dysphagia, shortness of breath, voice change. - She has steady body weight over the last several months.  - She denies palpitations, heat/cold intolerance, and tremors.  Review of Systems  Limited as above. Objective:    There were no vitals taken for this visit.  Wt Readings from Last 3 Encounters:  07/23/18 174 lb (78.9 kg)  12/22/17 174 lb (78.9 kg)  08/21/17 179 lb (81.2 kg)    Physical Exam    1)  - Dedicated thyroid ultrasound showed 2.3 cm nodule on the left lobe and 2 nodules on the isthmus (0.8 cm and 0.7 cm.)  2)  Fine needle aspiration  Diagnosis on 01/09/2017 showed: THYROID, FINE NEEDLE ASPIRATION, LEFT (SPECIMEN 1 OF 1 COLLECTED 71/12/18) ATYPIA OF UNDETERMINED SIGNIFICANCE OR FOLLICULAR LESION OF UNDETERMINED SIGNIFICANCE (BETHESDA CATEGORY III).  3) Thyroid, thyroidectomy- 02/10/2017. 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 - TUMOR CONFINED WITHIN RENAL CAPSULE. - SEPARATE ADENOMATOUS NODULES. - BENIGN PARATHYROID GLAND. 4) post therapy for body scan from 05/05/2017 showed  Thyroid remnant.  No scintigraphic evidence of iodine-avid metastatic thyroid cancer.  Recent Results (from the past 2160 hour(s))  T4, free     Status: None   Collection Time: 01/21/19 11:40 AM  Result Value Ref Range   Free T4 1.5 0.8 - 1.8 ng/dL  TSH     Status: Abnormal   Collection Time: 01/21/19 11:40 AM  Result Value Ref Range   TSH 0.08 (L) 0.40 - 4.50 mIU/L    Assessment & Plan:   1. Follicular variant of Papillary Thyroid Cancer - Her 02/10/2017 sub total thyroidectomy revealed 0.4 cm unifocal , circumscribed, follicular variant of papillary thyroid cancer.TNM code: pT1a, pNx  -  Due to the fact that it is follicular variant and her relative youth, she was given adjuvant   thyroid remnant ablation utilizing I-131 with post therapy whole-body scan. This showed  some thyroid remnant in the neck with no evidence of distant iodine avid metastasis.  -  Her stimulated thyroglobulin levels was detectable at 37. This warrants continuous surveillance for tumor recurrence. -Her most recent unstimulated thyroglobulin level was undetectable at less than 0.1. -She underwent surveillance thyroid/neck ultrasound in January 2020 which was negative for recurrent or residual tissue and no evidence of abnormal adenopathy.  -She will be considered for Thyrogen stimulated whole-body scan in 1 year.  2  Post thyroidectomy Hypothyroidism -Her recent thyroid function tests are consistent with appropriate suppressive thyroid hormone replacement .  - She is advised to continue levothyroxine 100 mcg p.o. every morning.   - We discussed about the  correct intake of her thyroid hormone, on empty stomach at fasting, with water, separated by at least 30 minutes from breakfast and other medications,  and separated by more than 4 hours from calcium, iron, multivitamins, acid reflux medications (PPIs). -Patient is made aware of the fact that thyroid hormone replacement is needed for life, dose to be adjusted by periodic monitoring of thyroid function tests.  - I advised patient to maintain close follow up with Lemmie Evens, MD for primary care needs.    Time for this visit: 15 minutes. Joyce Martin  participated in the discussions, expressed understanding, and voiced agreement with the above plans.  All questions were answered to her satisfaction. she is encouraged to contact clinic should she have any questions or concerns prior to her return visit.  Follow up plan: Return in about 4 months (around 05/29/2019) for Follow up with Pre-visit Labs.  Glade Lloyd, MD Phone: [phone]  Fax: [phone]  -  This note was partially dictated with voice recognition software. Similar sounding words can be transcribed inadequately or may not  be corrected upon review.  01/26/2019,  6:42 PM

## 2019-03-15 ENCOUNTER — Telehealth: Payer: Self-pay | Admitting: "Endocrinology

## 2019-03-15 NOTE — Telephone Encounter (Signed)
She is aware of the recommendation 

## 2019-03-15 NOTE — Telephone Encounter (Signed)
Her thyroid medication is not causing that symptom. She may need neck ultrasound if she continues to have this symptom.

## 2019-03-15 NOTE — Telephone Encounter (Signed)
Routing to Dr Nida for advice? 

## 2019-03-15 NOTE — Telephone Encounter (Signed)
Patient called and said the last 4 days she has a tingling in her neck on the left side. She said that she thinks that it is coming from her thyroid medication. Please advise.

## 2019-05-25 ENCOUNTER — Other Ambulatory Visit (HOSPITAL_COMMUNITY): Payer: Self-pay | Admitting: Adult Health

## 2019-05-25 DIAGNOSIS — Z1231 Encounter for screening mammogram for malignant neoplasm of breast: Secondary | ICD-10-CM

## 2019-06-02 LAB — TSH: TSH: 0.14 mIU/L — ABNORMAL LOW (ref 0.40–4.50)

## 2019-06-02 LAB — T4, FREE: Free T4: 1.4 ng/dL (ref 0.8–1.8)

## 2019-06-02 LAB — THYROGLOBULIN ANTIBODY: Thyroglobulin Ab: <1 IU/mL (ref <=1)

## 2019-06-02 LAB — THYROGLOBULIN LEVEL: Thyroglobulin: <0.1 ng/mL — ABNORMAL LOW

## 2019-06-03 ENCOUNTER — Encounter: Payer: Self-pay | Admitting: "Endocrinology

## 2019-06-03 ENCOUNTER — Ambulatory Visit (INDEPENDENT_AMBULATORY_CARE_PROVIDER_SITE_OTHER): Payer: Medicare Other | Admitting: "Endocrinology

## 2019-06-03 ENCOUNTER — Other Ambulatory Visit: Payer: Self-pay

## 2019-06-03 DIAGNOSIS — E890 Postprocedural hypothyroidism: Secondary | ICD-10-CM

## 2019-06-03 DIAGNOSIS — Z85850 Personal history of malignant neoplasm of thyroid: Secondary | ICD-10-CM

## 2019-06-03 MED ORDER — LEVOTHYROXINE SODIUM 100 MCG PO TABS: ORAL_TABLET | ORAL | 3 refills | Status: DC

## 2019-06-03 NOTE — Progress Notes (Signed)
06/03/2019                                Endocrinology Telehealth Visit Follow up Note -During COVID -19 Pandemic  I connected with Joyce Martin on 06/03/2019   by telephone and verified that I am speaking with the correct person using two identifiers. Joyce Martin, 01/14/1951. she has verbally consented to this visit. All issues noted in this document were discussed and addressed. The format was not optimal for physical exam   Subjective:    Patient ID: Joyce Martin, female    DOB: 12-26-1950, PCP Lemmie Evens, MD   Past Medical History:  Diagnosis Date  . Elevated BP 07/26/2014  . GERD (gastroesophageal reflux disease)   . Migraines   . 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 Thyroid cancer ([place]) 11/2016   Past Surgical History:  Procedure Laterality Date  . COLONOSCOPY N/A 04/18/2016   Procedure: COLONOSCOPY;  Surgeon: Danie Binder, MD;  Location[facility];  Service: Endoscopy;  Laterality: N/A;  9:30 AM  . THYROIDECTOMY N/A 02/10/2017   Procedure: THYROIDECTOMY;  Surgeon: Aviva Signs, MD;  Location: [facility];  Service: General;  Laterality: N/A;  . TUBAL LIGATION     Social History   Socioeconomic History  . Marital status: Divorced    Spouse name: Not on file  . Number of children: Not on file  . Years of education: Not on file  . Highest education level: Not on file  Occupational History  . Not on file  Social Needs  . Financial resource strain: Not on file  . Food insecurity    Worry: Not on file    Inability: Not on file  . Transportation needs    Medical: Not on file    Non-medical: Not on file  Tobacco Use  . Smoking status: Former Smoker    Packs/day: 0.25    Years: 5.00    Pack years: 1.25    Types: Cigarettes    Quit date: 01/30/1979    Years since quitting: 40.3  . Smokeless tobacco: Never Used  Substance and Sexual Activity  . Alcohol use: No  . Drug use: No  . Sexual activity: Yes    Birth  control/protection: Post-menopausal  Lifestyle  . Physical activity    Days per week: Not on file    Minutes per session: Not on file  . Stress: Not on file  Relationships  . Social Herbalist on phone: Not on file    Gets together: Not on file    Attends religious service: Not on file    Active member of club or organization: Not on file    Attends meetings of clubs or organizations: Not on file    Relationship status: Not on file  Other Topics Concern  . Not on file  Social History Narrative  . Not on file   Outpatient Encounter Medications as of 06/03/2019  Medication Sig  . levothyroxine (SYNTHROID) 100 MCG tablet TAKE 1 TABLET BY MOUTH ONCE DAILY BEFORE BREAKFAST  . Polyethyl Glycol-Propyl Glycol (SYSTANE ULTRA OP) Apply to eye.  . [DISCONTINUED] levothyroxine (SYNTHROID) 100 MCG tablet TAKE 1 TABLET BY MOUTH ONCE DAILY BEFORE BREAKFAST   No facility-administered encounter medications on file as of 06/03/2019.    ALLERGIES: Allergies  Allergen Reactions  . Aspirin Nausea Only  . Other Other (  See Comments)    COMBID: for stomach -twisting pt's face     VACCINATION STATUS: Immunization History  Administered Date(s) Administered  . Tdap 02/28/2011    HPI  68 year old female patient with medical history as above.  She is being engaged in telehealth via telephone for follow-up of postsurgical hypothyroidism, history of papillary thyroid cancer.   -Her history starts in 2018 when she was found to have abnormal fine-needle aspiration of thyroid nodule. She underwent total thyroidectomy on 02/10/2017 which revealed papillary thyroid cancer follicular variant. -Subsequently, she underwent  I-131 thyroid ablation with Thyrogen stimulation followed by whole body scan which revealed some thyroid remnant in the neck with no evidence of distant metastasis on 05/05/2017. January 2020 thyroid/neck ultrasound was unremarkable. - She is currently on levothyroxine 100 g by  mouth every morning.  She reports compliance to this medication.  She has no new complaints today. - She denies dysphagia, shortness of breath, voice change. - She has steady body weight over the last several months.  - She denies palpitations, heat/cold intolerance, and tremors.  Review of Systems  Limited as above. Objective:    There were no vitals taken for this visit.  Wt Readings from Last 3 Encounters:  07/23/18 174 lb (78.9 kg)  12/22/17 174 lb (78.9 kg)  08/21/17 179 lb (81.2 kg)    Physical Exam    1)  - Dedicated thyroid ultrasound showed 2.3 cm nodule on the left lobe and 2 nodules on the isthmus (0.8 cm and 0.7 cm.)  2)  Fine needle aspiration  Diagnosis on 01/09/2017 showed: THYROID, FINE NEEDLE ASPIRATION, LEFT (SPECIMEN 1 OF 1 COLLECTED 71/12/18) ATYPIA OF UNDETERMINED SIGNIFICANCE OR FOLLICULAR LESION OF UNDETERMINED SIGNIFICANCE (BETHESDA CATEGORY III).  3) Thyroid, thyroidectomy- 02/10/2017. 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 - TUMOR CONFINED WITHIN RENAL CAPSULE. - SEPARATE ADENOMATOUS NODULES. - BENIGN PARATHYROID GLAND. 4) post therapy for body scan from 05/05/2017 showed  Thyroid remnant.  No scintigraphic evidence of iodine-avid metastatic thyroid cancer.  Recent Results (from the past 2160 hour(s))  TSH     Status: Abnormal   Collection Time: 06/01/19  1:18 PM  Result Value Ref Range   TSH 0.14 (L) 0.40 - 4.50 mIU/L  T4, free     Status: None   Collection Time: 06/01/19  1:18 PM  Result Value Ref Range   Free T4 1.4 0.8 - 1.8 ng/dL  Thyroglobulin antibody     Status: None   Collection Time: 06/01/19  1:18 PM  Result Value Ref Range   Thyroglobulin Ab <1 < or = 1 IU/mL  Thyroglobulin Level     Status: Abnormal   Collection Time: 06/01/19  1:18 PM  Result Value Ref Range   Thyroglobulin <0.1 (L) ng/mL    Comment:       Reference Range:       Intact Thyroid   2.8-40.9       Athyrotic        <0.1 .       Note:  Abnormal flagging is based       on the reference interval for        patients with intact thyroid. . . This test was performed using the Beckman Coulter  chemiluminescent method. Values obtained from different assay methods cannot be used interchangeably. Thyroglobulin levels, regardless of value, should not be interpreted as absolute evidence of the presence or absence of disease. .    Comment      Comment: .  Thyroglobulin antibodies (TGAB) interfere with thyroglobulin (TG) assays; therefore, TGAB assay should always be performed in conjunction with a TG assay. . . For additional information, please refer to  http://education.quest[employer].com/faq/FAQ202  (This link is being provided for informational/ educational purposes only.) .     Assessment & Plan:   1. Follicular variant of Papillary Thyroid Cancer - Her 02/10/2017 sub total thyroidectomy revealed 0.4 cm unifocal , circumscribed, follicular variant of papillary thyroid cancer.TNM code: pT1a, pNx  -  Due to the fact that it is follicular variant and her relative youth, she was given adjuvant   thyroid remnant ablation utilizing I-131 with post therapy whole-body scan. This showed some thyroid remnant in the neck with no evidence of distant iodine avid metastasis.  -  Her stimulated thyroglobulin levels was detectable at 37. This warrants continuous surveillance for tumor recurrence. -Her most recent unstimulated thyroglobulin level was undetectable at less than 0.1. -She underwent surveillance thyroid/neck ultrasound in January 2020 which was negative for recurrent or residual tissue and no evidence of abnormal adenopathy.  -Her thyroglobulin level is less than 0.1, with undetectable antithyroglobulin antibodies. -She will be considered for Thyrogen stimulated whole-body scan before her next visit in 1 year.  2  Post thyroidectomy Hypothyroidism -Her recent thyroid function tests are suggestive of appropriate  replacement.  Recent thyroid function tests are consistent with appropriate suppressive thyroid hormone replacement .  - She is advised to continue levothyroxine 100 mcg p.o. every morning.  - We discussed about the correct intake of her thyroid hormone, on empty stomach at fasting, with water, separated by at least 30 minutes from breakfast and other medications,  and separated by more than 4 hours from calcium, iron, multivitamins, acid reflux medications (PPIs). -Patient is made aware of the fact that thyroid hormone replacement is needed for life, dose to be adjusted by periodic monitoring of thyroid function tests.   - I advised patient to maintain close follow up with Lemmie Evens, MD for primary care needs.     Time for this visit: 15 minutes. Joyce Martin  participated in the discussions, expressed understanding, and voiced agreement with the above plans.  All questions were answered to her satisfaction. she is encouraged to contact clinic should she have any questions or concerns prior to her return visit.   Follow up plan: Return in about 1 year (around 06/02/2020) for Follow up with Pre-visit Labs, Follow up with Whole Body Scan w/Thyrogen.  Glade Lloyd, MD Phone: [phone]  Fax: [phone]  -  This note was partially dictated with voice recognition software. Similar sounding words can be transcribed inadequately or may not  be corrected upon review.  06/03/2019, 10:03 AM

## 2019-06-09 ENCOUNTER — Encounter (HOSPITAL_COMMUNITY): Payer: Medicare Other

## 2019-06-09 ENCOUNTER — Telehealth: Payer: Self-pay | Admitting: "Endocrinology

## 2019-06-09 MED ORDER — LEVOTHYROXINE SODIUM 100 MCG PO TABS: ORAL_TABLET | ORAL | 3 refills | Status: DC

## 2019-06-09 NOTE — Telephone Encounter (Signed)
Pt needs her levothyroxine (SYNTHROID) 100 MCG tablet sent to [facility]. It orginially went to [place]. I removed it from her chart, she no longer uses that pharmacy

## 2019-06-10 ENCOUNTER — Encounter (HOSPITAL_COMMUNITY): Payer: Medicare Other

## 2019-06-11 ENCOUNTER — Encounter (HOSPITAL_COMMUNITY): Payer: Medicare Other

## 2019-06-14 ENCOUNTER — Encounter (HOSPITAL_COMMUNITY): Payer: Medicare Other

## 2019-06-28 ENCOUNTER — Ambulatory Visit (HOSPITAL_COMMUNITY)
Admission: RE | Admit: 2019-06-28 | Discharge: 2019-06-28 | Disposition: A | Discharge status: Home / Self Care | Payer: Medicare Other | Source: Ambulatory Visit | Attending: Adult Health | Admitting: Adult Health

## 2019-06-28 ENCOUNTER — Other Ambulatory Visit: Payer: Self-pay

## 2019-06-28 DIAGNOSIS — Z1231 Encounter for screening mammogram for malignant neoplasm of breast: Secondary | ICD-10-CM | POA: Diagnosis not present

## 2019-08-09 ENCOUNTER — Ambulatory Visit: Payer: Medicare Other

## 2019-09-15 ENCOUNTER — Other Ambulatory Visit: Payer: Self-pay

## 2019-09-15 ENCOUNTER — Ambulatory Visit: Payer: Medicare Other | Admitting: Podiatrist

## 2019-09-15 ENCOUNTER — Other Ambulatory Visit: Payer: Self-pay | Admitting: Podiatrist

## 2019-09-15 ENCOUNTER — Ambulatory Visit (INDEPENDENT_AMBULATORY_CARE_PROVIDER_SITE_OTHER): Payer: Medicare Other

## 2019-09-15 ENCOUNTER — Encounter: Payer: Self-pay | Admitting: Podiatrist

## 2019-09-15 VITALS — BP 160/88 | HR 92 | Temp 96.9°F | Resp 16

## 2019-09-15 DIAGNOSIS — B351 Tinea unguium: Secondary | ICD-10-CM

## 2019-09-15 DIAGNOSIS — M722 Plantar fascial fibromatosis: Secondary | ICD-10-CM

## 2019-09-15 DIAGNOSIS — M79672 Pain in left foot: Secondary | ICD-10-CM

## 2019-09-15 NOTE — Progress Notes (Signed)
    Chief Complaint  Patient presents with  . Foot Pain    Left foot; bottom of heel; pt stated, "For the past 2 weeks, foot has felt fine"; x1 month     HPI: Patient is 69 y.o. female who presents today for a severe heel pain that was present but then it sporadically resolved about 2 weeks ago.  Patient relates the heel was so sore she wouldn't let anyone touch it and she feels it may have been gout. The heel is fine today.  She also has a toenail she would like checked.    Allergies  Allergen Reactions  . Aspirin Nausea Only  . Other Other (See Comments)    COMBID: for stomach -twisting pt's face     Review of systems is reviewed and negative.   Physical Exam  Patient is awake, alert, and oriented x 3.  In no acute distress.    Vascular status is intact with palpable pedal pulses DP and PT bilateral and capillary refill time less than 3 seconds bilateral.  No edema or erythema noted.   Neurological exam reveals epicritic and protective sensation grossly intact bilateral.   Dermatological exam reveals skin is supple and dry to bilateral feet.  No open lesions present.  Left hallux has some yellow discoloration on the medial portion of the nail which appears fungal in nature due to its subungual debris present.  Musculoskeletal exam: Musculature intact with dorsiflexion, plantarflexion, inversion, eversion. Ankle and First MPJ joint range of motion normal.  No pain elicited in left heel with examination. No pain posterior heel noted.      Assessment: Heel pain- resolved at today's visit Fungal toenail   Plan: Recommended otc inserts to support the patients heel and arch,  Recommended use of fungicure on the toenail.  Patient will call if heel pain is to return and if any questions or concerns arise.

## 2019-09-15 NOTE — Patient Instructions (Signed)

## 2019-09-20 ENCOUNTER — Encounter: Payer: Self-pay | Admitting: Podiatrist

## 2020-03-09 ENCOUNTER — Ambulatory Visit: Payer: Medicare Other | Admitting: Podiatry

## 2020-03-09 ENCOUNTER — Other Ambulatory Visit: Payer: Self-pay

## 2020-03-09 ENCOUNTER — Encounter: Payer: Self-pay | Admitting: Podiatry

## 2020-03-09 DIAGNOSIS — M19079 Primary osteoarthritis, unspecified ankle and foot: Secondary | ICD-10-CM | POA: Diagnosis not present

## 2020-03-09 DIAGNOSIS — L603 Nail dystrophy: Secondary | ICD-10-CM | POA: Diagnosis not present

## 2020-03-09 DIAGNOSIS — M722 Plantar fascial fibromatosis: Secondary | ICD-10-CM

## 2020-03-09 NOTE — Progress Notes (Signed)
She presents today saw Dr. Rolley Sims last time she was and states that my heel started hurting again though the inserts are feeling pretty good she wears the insoles at the beginning but states that she does wear them anymore because they are not helping.  Is also concerned about discoloration of the hallux nail left.  Objective: Vital signs are stable she alert oriented x3 pulses are palpable.  Nail dystrophy hallux left however she does have pain on palpation medial calcaneal tubercle of the left heel.  Assessment: Chronic proximal plantar fasciitis left heel.  #1 onychodystrophy hallux and second digit of the left foot.  Plan: Samples of the skin and nail were taken today to be sent for pathologic evaluation I also injected her left heel with 20 mg of Kenalog 5 mg Marcaine point maximal tenderness after Betadine skin prep.  She tolerated procedure well we also got her into a night splint and I will follow-up with her in 1 month.

## 2020-03-09 NOTE — Patient Instructions (Signed)

## 2020-03-22 NOTE — Progress Notes (Signed)
Patient has an appointment on 04/04/20

## 2020-04-04 ENCOUNTER — Encounter: Payer: Self-pay | Admitting: Podiatry

## 2020-04-04 ENCOUNTER — Ambulatory Visit: Payer: Medicare Other | Admitting: Podiatry

## 2020-04-04 ENCOUNTER — Other Ambulatory Visit: Payer: Self-pay

## 2020-04-04 DIAGNOSIS — L603 Nail dystrophy: Secondary | ICD-10-CM | POA: Diagnosis not present

## 2020-04-04 MED ORDER — FLUCONAZOLE 150 MG PO TABS: 300.0000 mg | ORAL_TABLET | ORAL | 0 refills | Status: DC

## 2020-04-05 NOTE — Progress Notes (Signed)
She presents today for follow-up of her plantar fasciitis of her left heel states that it is much much better she states that is approximately 90% and ever improving.  She also presents today for follow-up of her pathology for her toenails.  Objective: Vital signs are stable she is alert oriented x3 has very little pain on palpation medial calcaneal tubercle of the left heel.  Pathology does demonstrate yeast infection to the toenails.  Assessment: Onychomycosis with yeast.  Resolving plantar fasciitis left 90% improved.  Plan: Discussed etiology pathology conservative versus surgical therapies.  At this point I would continue all conservative therapies for the plantar fasciitis.  We are also going to start her on fluconazole 2 tablets a day for 1 week out of each month.  I will follow-up with her in 4 months for this for another blood work.  Should her heel start to become more painful she will notify us immediately.

## 2020-05-01 ENCOUNTER — Telehealth: Payer: Self-pay

## 2020-05-01 NOTE — Telephone Encounter (Signed)
Josem Kaufmann [phone] for CPT code [phone] per Kayla C at ins.

## 2020-05-01 NOTE — Telephone Encounter (Signed)
Left message for return call.

## 2020-05-01 NOTE — Telephone Encounter (Signed)
Pre service center needs PA on this pt's nuc med scan.  Call back @ [phone] ext [phone] [facility])

## 2020-05-01 NOTE — Telephone Encounter (Signed)
Changed CPT code to [phone]. PA approved # [id number]

## 2020-05-03 ENCOUNTER — Encounter (HOSPITAL_COMMUNITY): Payer: Self-pay

## 2020-05-03 ENCOUNTER — Encounter (HOSPITAL_COMMUNITY)
Admission: RE | Admit: 2020-05-03 | Discharge: 2020-05-03 | Disposition: A | Discharge status: Home / Self Care | Payer: Medicare Other | Source: Ambulatory Visit | Attending: "Endocrinology | Admitting: "Endocrinology

## 2020-05-03 ENCOUNTER — Other Ambulatory Visit: Payer: Self-pay

## 2020-05-03 DIAGNOSIS — Z85850 Personal history of malignant neoplasm of thyroid: Secondary | ICD-10-CM | POA: Insufficient documentation

## 2020-05-03 MED ORDER — THYROTROPIN ALFA 0.9 MG IM SOLR: 0.9000 mg | INTRAMUSCULAR | Status: AC

## 2020-05-03 MED ORDER — THYROTROPIN ALFA 0.9 MG IM SOLR
INTRAMUSCULAR | Status: AC
  Administered 2020-05-03: 0.9 mg via INTRAMUSCULAR
  Filled 2020-05-03: qty 0.9

## 2020-05-03 MED ORDER — STERILE WATER FOR INJECTION IJ SOLN
INTRAMUSCULAR | Status: AC
  Administered 2020-05-03: 1 mL
  Filled 2020-05-03: qty 10

## 2020-05-03 NOTE — Discharge Instructions (Signed)
Thyrotropin Alfa injection What is this medicine? THYROTROPIN ALFA (thahy ruh TROH pin AL fa) is a man-made protein. It is used to diagnose any remaining thyroid cancer after treatment. It is also used to help treat thyroid cancer. This medicine may be used for other purposes; ask your health care provider or pharmacist if you have questions. COMMON BRAND NAME(S): Thyrogen What should I tell my health care provider before I take this medicine? They need to know if you have any of these conditions:  cancer that has spread to other parts of the body  have thyroid tissue remaining  heart disease  kidney disease  smoke tobacco  an unusual or allergic reaction to thyrotropin, thyroid products, other hormones, medicines, foods, or preservatives  pregnant or trying to get pregnant  breast-feeding How should I use this medicine? This medicine is for injection into a muscle. It is given by a health care professional in a hospital or clinic setting. Talk to your pediatrician regarding the use of this medicine in children. Special care may be needed. Overdosage: If you think you have taken too much of this medicine contact a poison control center or emergency room at once. NOTE: This medicine is only for you. Do not share this medicine with others. What if I miss a dose? It is important not to miss your dose. Call your doctor or health care professional if you are unable to keep an appointment. What may interact with this medicine? Interactions are not expected. This list may not describe all possible interactions. Give your health care provider a list of all the medicines, herbs, non-prescription drugs, or dietary supplements you use. Also tell them if you smoke, drink alcohol, or use illegal drugs. Some items may interact with your medicine. What should I watch for while using this medicine? Visit your doctor or health care professional regularly. Your condition will be monitored carefully  while you are receiving this medicine. You may need blood work done while you are taking this medicine. What side effects may I notice from receiving this medicine? Side effects that you should report to your doctor or health care professional as soon as possible:  allergic reactions like skin rash, itching or hives, swelling of the face, lips, or tongue  breathing problems  chest pain  signs and symptoms of a stroke like changes in vision; confusion; trouble speaking or understanding; severe headaches; sudden numbness or weakness of the face, arm or leg; trouble walking; dizziness; loss of balance or coordination Side effects that usually do not require medical attention (report to your doctor or health care professional if they continue or are bothersome):  dizziness  flu-like symptoms  headache  nausea, vomiting  weak or tired This list may not describe all possible side effects. Call your doctor for medical advice about side effects. You may report side effects to FDA at 1-800-FDA-1088. Where should I keep my medicine? This drug is given in a hospital or clinic and will not be stored at home. NOTE: This sheet is a summary. It may not cover all possible information. If you have questions about this medicine, talk to your doctor, pharmacist, or health care provider.  2020 Elsevier/Gold Standard (2014-11-11 15:04:47)

## 2020-05-04 ENCOUNTER — Encounter (HOSPITAL_COMMUNITY)
Admission: RE | Admit: 2020-05-04 | Discharge: 2020-05-04 | Disposition: A | Discharge status: Home / Self Care | Payer: Medicare Other | Source: Ambulatory Visit | Attending: "Endocrinology | Admitting: "Endocrinology

## 2020-05-04 ENCOUNTER — Encounter (HOSPITAL_COMMUNITY): Payer: Self-pay

## 2020-05-04 DIAGNOSIS — Z85850 Personal history of malignant neoplasm of thyroid: Secondary | ICD-10-CM | POA: Diagnosis not present

## 2020-05-04 MED ORDER — THYROTROPIN ALFA 0.9 MG IM SOLR: 0.9000 mg | INTRAMUSCULAR | Status: AC

## 2020-05-04 MED ORDER — STERILE WATER FOR INJECTION IJ SOLN
INTRAMUSCULAR | Status: AC
  Administered 2020-05-04: 10 mL
  Filled 2020-05-04: qty 10

## 2020-05-04 MED ORDER — THYROTROPIN ALFA 0.9 MG IM SOLR
INTRAMUSCULAR | Status: AC
  Administered 2020-05-04: 0.9 mg via INTRAMUSCULAR
  Filled 2020-05-04: qty 0.9

## 2020-05-05 ENCOUNTER — Other Ambulatory Visit (HOSPITAL_COMMUNITY)
Admission: RE | Admit: 2020-05-05 | Discharge: 2020-05-05 | Disposition: A | Discharge status: Home / Self Care | Payer: Medicare Other | Source: Ambulatory Visit | Attending: "Endocrinology | Admitting: "Endocrinology

## 2020-05-05 ENCOUNTER — Encounter (HOSPITAL_COMMUNITY)
Admission: RE | Admit: 2020-05-05 | Discharge: 2020-05-05 | Disposition: A | Discharge status: Home / Self Care | Payer: Medicare Other | Source: Ambulatory Visit | Attending: "Endocrinology | Admitting: "Endocrinology

## 2020-05-05 ENCOUNTER — Other Ambulatory Visit: Payer: Self-pay

## 2020-05-05 ENCOUNTER — Encounter (HOSPITAL_COMMUNITY): Payer: Self-pay

## 2020-05-05 DIAGNOSIS — Z85850 Personal history of malignant neoplasm of thyroid: Secondary | ICD-10-CM | POA: Insufficient documentation

## 2020-05-05 DIAGNOSIS — E890 Postprocedural hypothyroidism: Secondary | ICD-10-CM | POA: Diagnosis present

## 2020-05-05 LAB — TSH: TSH: 151.606 u[IU]/mL — ABNORMAL HIGH (ref 0.350–4.500)

## 2020-05-05 LAB — T4, FREE: Free T4: 1.4 ng/dL — ABNORMAL HIGH (ref 0.61–1.12)

## 2020-05-05 MED ORDER — SODIUM IODIDE I 131 CAPSULE
4.0000 | Freq: Once | INTRAVENOUS | Status: AC | PRN
  Administered 2020-05-05: 3.7 via ORAL

## 2020-05-07 LAB — THYROGLOBULIN ANTIBODY: Thyroglobulin Antibody: <1 IU/mL (ref 0.0–0.9)

## 2020-05-08 ENCOUNTER — Other Ambulatory Visit: Payer: Self-pay

## 2020-05-08 ENCOUNTER — Encounter (HOSPITAL_COMMUNITY)
Admission: RE | Admit: 2020-05-08 | Discharge: 2020-05-08 | Disposition: A | Discharge status: Home / Self Care | Payer: Medicare Other | Source: Ambulatory Visit | Attending: "Endocrinology | Admitting: "Endocrinology

## 2020-05-10 LAB — THYROGLOBULIN LEVEL: Thyroglobulin: <2 ng/mL

## 2020-05-15 ENCOUNTER — Other Ambulatory Visit (HOSPITAL_COMMUNITY): Payer: Self-pay | Admitting: Adult Health

## 2020-05-15 DIAGNOSIS — Z1231 Encounter for screening mammogram for malignant neoplasm of breast: Secondary | ICD-10-CM

## 2020-06-06 ENCOUNTER — Ambulatory Visit: Payer: Medicare Other | Admitting: "Endocrinology

## 2020-06-15 ENCOUNTER — Encounter: Payer: Self-pay | Admitting: "Endocrinology

## 2020-06-15 ENCOUNTER — Other Ambulatory Visit: Payer: Self-pay

## 2020-06-15 ENCOUNTER — Ambulatory Visit (INDEPENDENT_AMBULATORY_CARE_PROVIDER_SITE_OTHER): Payer: Medicare Other | Admitting: "Endocrinology

## 2020-06-15 VITALS — BP 154/86 | HR 120 | Ht 68.0 in | Wt 166.5 lb

## 2020-06-15 DIAGNOSIS — Z85850 Personal history of malignant neoplasm of thyroid: Secondary | ICD-10-CM

## 2020-06-15 DIAGNOSIS — E890 Postprocedural hypothyroidism: Secondary | ICD-10-CM

## 2020-06-15 MED ORDER — LEVOTHYROXINE SODIUM 100 MCG PO TABS
ORAL_TABLET | ORAL | 3 refills | Status: DC
Start: 2020-06-15 — End: 2021-06-18

## 2020-06-15 NOTE — Progress Notes (Signed)
06/15/2020             Endocrinology follow-up note   Subjective:    Patient ID: Joyce Martin, female    DOB: 02-07-1951, PCP Lemmie Evens, MD   Past Medical History:  Diagnosis Date  . Elevated BP 07/26/2014  . GERD (gastroesophageal reflux disease)   . Migraines   . 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 Thyroid cancer ([place]) 11/2016   Past Surgical History:  Procedure Laterality Date  . COLONOSCOPY N/A 04/18/2016   Procedure: COLONOSCOPY;  Surgeon: Danie Binder, MD;  Location: [facility];  Service: Endoscopy;  Laterality: N/A;  9:30 AM  . THYROIDECTOMY N/A 02/10/2017   Procedure: THYROIDECTOMY;  Surgeon: Aviva Signs, MD;  Location: [facility] ORS;  Service: General;  Laterality: N/A;  . TUBAL LIGATION     Social History   Socioeconomic History  . Marital status: Divorced    Spouse name: Not on file  . Number of children: Not on file  . Years of education: Not on file  . Highest education level: Not on file  Occupational History  . Not on file  Tobacco Use  . Smoking status: Former Smoker    Packs/day: 0.25    Years: 5.00    Pack years: 1.25    Types: Cigarettes    Quit date: 01/30/1979    Years since quitting: 41.4  . Smokeless tobacco: Never Used  Vaping Use  . Vaping Use: Never used  Substance and Sexual Activity  . Alcohol use: No  . Drug use: No  . Sexual activity: Yes    Birth control/protection: Post-menopausal  Other Topics Concern  . Not on file  Social History Narrative  . Not on file   Social Determinants of Health   Financial Resource Strain: Not on file  Food Insecurity: Not on file  Transportation Needs: Not on file  Physical Activity: Not on file  Stress: Not on file  Social Connections: Not on file   Outpatient Encounter Medications as of 06/15/2020  Medication Sig  . levothyroxine (SYNTHROID) 100 MCG tablet TAKE 1 TABLET BY MOUTH ONCE DAILY BEFORE BREAKFAST  . Propylene Glycol (SYSTANE COMPLETE) 0.6 %  SOLN Apply to eye.  . [DISCONTINUED] fluconazole (DIFLUCAN) 150 MG tablet Take 2 tablets (300 mg total) by mouth once a week.  . [DISCONTINUED] levothyroxine (SYNTHROID) 100 MCG tablet TAKE 1 TABLET BY MOUTH ONCE DAILY BEFORE BREAKFAST  . [DISCONTINUED] Polyethyl Glycol-Propyl Glycol (SYSTANE ULTRA OP) Apply to eye.   No facility-administered encounter medications on file as of 06/15/2020.   ALLERGIES: Allergies  Allergen Reactions  . Aspirin Nausea Only  . Other Other (See Comments)    COMBID: for stomach -twisting pt's face     VACCINATION STATUS: Immunization History  Administered Date(s) Administered  . Influenza, High Dose Seasonal PF 03/10/2019  . Tdap 02/28/2011    HPI  69 year old female patient with medical history as above.  She is being seen in follow-up for  postsurgical hypothyroidism, history of papillary thyroid cancer.   -Her history starts in 2018 when she was found to have abnormal fine-needle aspiration of thyroid nodule. She underwent total thyroidectomy on 02/10/2017 which revealed papillary thyroid cancer follicular variant. -Subsequently, she underwent  I-131 thyroid ablation with Thyrogen stimulation followed by whole body scan which revealed some thyroid remnant in the neck with no evidence of distant metastasis on 05/05/2017. January 2020 thyroid/neck ultrasound was unremarkable. -Her previsit Thyrogen stimulated whole-body scan  on May 08, 2020 was consistent with no evidence of iodine avid distant metastasis. - She is currently on levothyroxine 100 g by mouth every morning.  She reports compliance to this medication.  She has no new complaints today. - She denies dysphagia, shortness of breath, voice change. - She has steady body weight over the last several months.  - She denies palpitations, heat/cold intolerance, and tremors.  Review of Systems  Limited as above. Objective:    BP (!) 154/86   Pulse (!) 120   Ht 5\' 8"  (1.727 m)   Wt 166 lb 8  oz (75.5 kg)   BMI 25.32 kg/m   Wt Readings from Last 3 Encounters:  06/15/20 166 lb 8 oz (75.5 kg)  07/23/18 174 lb (78.9 kg)  12/22/17 174 lb (78.9 kg)    Physical Exam    1)  - Dedicated thyroid ultrasound showed 2.3 cm nodule on the left lobe and 2 nodules on the isthmus (0.8 cm and 0.7 cm.)  2)  Fine needle aspiration  Diagnosis on 01/09/2017 showed: THYROID, FINE NEEDLE ASPIRATION, LEFT (SPECIMEN 1 OF 1 COLLECTED 71/12/18) ATYPIA OF UNDETERMINED SIGNIFICANCE OR FOLLICULAR LESION OF UNDETERMINED SIGNIFICANCE ([place] CATEGORY III).  3) Thyroid, thyroidectomy- 02/10/2017. 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 - TUMOR CONFINED WITHIN RENAL CAPSULE. - SEPARATE ADENOMATOUS NODULES. - BENIGN PARATHYROID GLAND. 4) post therapy for body scan from 05/05/2017 showed  Thyroid remnant.  No scintigraphic evidence of iodine-avid metastatic thyroid cancer.   Thyrogen stimulated whole-body scan on May 08, 2020. FINDINGS: No residual radioiodine uptake in the cervical region.  Excreted tracer within colon, urinary bladder, minimally in stomach.  No abnormal sites of radio iodine accumulation to suggest iodine-avid metastatic thyroid cancer.  IMPRESSION: No scintigraphic evidence of iodine-avid metastatic thyroid cancer.   Recent Results (from the past 2160 hour(s))  Thyroglobulin Level     Status: None   Collection Time: 05/05/20  9:25 AM  Result Value Ref Range   Thyroglobulin <2.0 ng/mL    Comment: (NOTE) This test was developed and its performance characteristics determined by [facility]. It has not been cleared or approved by the Food and Drug Administration. Reference Range: Pubertal Children and Adults: <40 According to the [facility] of Clinical Biochemistry, the reference interval for Thyroglobulin (TG) should be related to euthyroid patients and not for patients who underwent thyroidectomy.  TG reference intervals for  these patients depend on the residual mass of the thyroid tissue left after surgery.  Establishing a post-operative baseline is recommended.  The assay quantitation limit is 2.0 ng/mL. Performed At: [place], [place] [id number] Jake Bathe F MD QA:[id number]   Thyroglobulin antibody     Status: None   Collection Time: 05/05/20  9:25 AM  Result Value Ref Range   Thyroglobulin Antibody <1.0 0.0 - 0.9 IU/mL    Comment: (NOTE) Thyroglobulin Antibody measured by [facility] Methodology Performed At: [facility] [place], [place] [id number] Rush Farmer MD [id number]   T4, free     Status: Abnormal   Collection Time: 05/05/20  9:25 AM  Result Value Ref Range   Free T4 1.40 (H) 0.61 - 1.12 ng/dL    Comment: (NOTE) Biotin ingestion may interfere with free T4 tests. If the results are inconsistent with the TSH level, previous test results, or the clinical presentation, then consider biotin interference. If needed, order repeat testing after stopping biotin. Performed at [facility], [address] [place], [place] [address]   TSH     Status: Abnormal   Collection Time: 05/05/20  9:27 AM  Result Value Ref Range   TSH 151.606 (H) 0.350 - 4.500 uIU/mL    Comment: Performed by a 3rd Generation assay with a functional sensitivity of <=0.01 uIU/mL. Performed at [facility], [address] [place], [place] [address]     Assessment & Plan:   1. Follicular variant of Papillary Thyroid Cancer - Her 02/10/2017 sub total thyroidectomy revealed 0.4 cm unifocal , circumscribed, follicular variant of papillary thyroid cancer.TNM code: pT1a, pNx  -  Due to the fact that it is follicular variant and her relative youth, she was given adjuvant   thyroid remnant ablation utilizing I-131 with post therapy whole-body scan. This showed some thyroid remnant in the neck with no evidence of distant iodine avid metastasis.  -   Her stimulated thyroglobulin levels was detectable at 37. This warrants continuous surveillance for tumor recurrence. -Her most recent unstimulated thyroglobulin level was undetectable at less than 0.1. -She underwent surveillance thyroid/neck ultrasound in January 2020 which was negative for recurrent or residual tissue and no evidence of abnormal adenopathy.  -Her thyroglobulin level is less than 0.1, with undetectable antithyroglobulin antibodies. - Thyrogen stimulated whole-body scan -on November 2021 was negative for iodine avid distant metastasis.   -Will not need further intervention at this time.   2  Post thyroidectomy Hypothyroidism -Her recent thyroid function tests are consistent with appropriate replacement.  She is advised to continue levothyroxine 100 mcg p.o. daily before breakfast.    - We discussed about the correct intake of her thyroid hormone, on empty stomach at fasting, with water, separated by at least 30 minutes from breakfast and other medications,  and separated by more than 4 hours from calcium, iron, multivitamins, acid reflux medications (PPIs). -Patient is made aware of the fact that thyroid hormone replacement is needed for life, dose to be adjusted by periodic monitoring of thyroid function tests.  - I advised patient to maintain close follow up with Lemmie Evens, MD for primary care needs.     - Time spent on this patient care encounter:  20 minutes of which 50% was spent in  counseling and the rest reviewing  her current and  previous labs / studies and medications  doses and developing a plan for long term care. Joyce Martin  participated in the discussions, expressed understanding, and voiced agreement with the above plans.  All questions were answered to her satisfaction. she is encouraged to contact clinic should she have any questions or concerns prior to her return visit.   Follow up plan: Return in about 1 year (around 06/15/2021) for F/U with  Pre-visit Labs.  Glade Lloyd, MD Phone: [phone]  Fax: [phone]  -  This note was partially dictated with voice recognition software. Similar sounding words can be transcribed inadequately or may not  be corrected upon review.  06/15/2020, 9:08 PM

## 2020-06-28 ENCOUNTER — Other Ambulatory Visit: Payer: Self-pay

## 2020-06-28 ENCOUNTER — Ambulatory Visit (HOSPITAL_COMMUNITY)
Admission: RE | Admit: 2020-06-28 | Discharge: 2020-06-28 | Disposition: A | Discharge status: Home / Self Care | Payer: Medicare Other | Source: Ambulatory Visit | Attending: Adult Health | Admitting: Adult Health

## 2020-06-28 DIAGNOSIS — Z1231 Encounter for screening mammogram for malignant neoplasm of breast: Secondary | ICD-10-CM | POA: Diagnosis present

## 2020-06-29 ENCOUNTER — Ambulatory Visit (HOSPITAL_COMMUNITY): Payer: Medicare Other

## 2020-08-08 ENCOUNTER — Ambulatory Visit: Payer: Medicare Other | Admitting: Podiatry

## 2021-03-08 ENCOUNTER — Encounter (INDEPENDENT_AMBULATORY_CARE_PROVIDER_SITE_OTHER): Payer: Medicare Other | Admitting: Ophthalmology

## 2021-03-08 ENCOUNTER — Other Ambulatory Visit: Payer: Self-pay

## 2021-03-08 DIAGNOSIS — H2513 Age-related nuclear cataract, bilateral: Secondary | ICD-10-CM | POA: Diagnosis not present

## 2021-03-08 DIAGNOSIS — H33013 Retinal detachment with single break, bilateral: Secondary | ICD-10-CM

## 2021-03-08 DIAGNOSIS — H43813 Vitreous degeneration, bilateral: Secondary | ICD-10-CM

## 2021-03-15 ENCOUNTER — Encounter (INDEPENDENT_AMBULATORY_CARE_PROVIDER_SITE_OTHER): Payer: Medicare Other | Admitting: Ophthalmology

## 2021-03-15 ENCOUNTER — Other Ambulatory Visit: Payer: Self-pay

## 2021-03-15 DIAGNOSIS — H33012 Retinal detachment with single break, left eye: Secondary | ICD-10-CM

## 2021-03-15 HISTORY — PX: REFRACTIVE SURGERY: SHX103

## 2021-03-28 ENCOUNTER — Other Ambulatory Visit: Payer: Self-pay

## 2021-03-28 ENCOUNTER — Encounter (INDEPENDENT_AMBULATORY_CARE_PROVIDER_SITE_OTHER): Payer: Medicare Other | Admitting: Ophthalmology

## 2021-03-28 DIAGNOSIS — H33303 Unspecified retinal break, bilateral: Secondary | ICD-10-CM

## 2021-04-16 ENCOUNTER — Encounter: Payer: Self-pay | Admitting: *Deleted

## 2021-05-03 ENCOUNTER — Encounter: Payer: Self-pay | Admitting: *Deleted

## 2021-05-29 ENCOUNTER — Other Ambulatory Visit: Payer: Self-pay

## 2021-05-29 ENCOUNTER — Encounter: Payer: Self-pay | Admitting: *Deleted

## 2021-05-29 ENCOUNTER — Ambulatory Visit (INDEPENDENT_AMBULATORY_CARE_PROVIDER_SITE_OTHER): Payer: Self-pay | Admitting: *Deleted

## 2021-05-29 VITALS — Ht 68.0 in | Wt 171.2 lb

## 2021-05-29 DIAGNOSIS — Z86010 Personal history of colonic polyps: Secondary | ICD-10-CM

## 2021-05-29 MED ORDER — NA SULFATE-K SULFATE-MG SULF 17.5-3.13-1.6 GM/177ML PO SOLN: 1.0000 | Freq: Once | ORAL | 0 refills | Status: AC

## 2021-05-29 NOTE — Progress Notes (Addendum)
Gastroenterology Pre-Procedure Review  Request Date: 05/29/2021 Requesting Physician: 5 year recall, Last TCS done 04/18/2016 by Dr. Oneida Alar, tubular adenoma (x1)  PATIENT REVIEW QUESTIONS: The patient responded to the following health history questions as indicated:    1. Diabetes Melitis: no 2. Joint replacements in the past 12 months: no 3. Major health problems in the past 3 months: yes, eye surgery Sept 2022 4. Has an artificial valve or MVP: no 5. Has a defibrillator: no 6. Has been advised in past to take antibiotics in advance of a procedure like teeth cleaning: no 7. Family history of colon cancer: no 8. Alcohol Use: no 9. Illicit drug Use: no 10. History of sleep apnea: no 11. History of coronary artery or other vascular stents placed within the last 12 months: no 12. History of any prior anesthesia complications: no 13. Body mass index is 26.03 kg/m.    MEDICATIONS & ALLERGIES:    Patient reports the following regarding taking any blood thinners:   Plavix? no Aspirin? no Coumadin? no Brilinta? no Xarelto? no Eliquis? no Pradaxa? no Savaysa? no Effient? no  Patient confirms/reports the following medications:  Current Outpatient Medications  Medication Sig Dispense Refill   levothyroxine (SYNTHROID) 100 MCG tablet TAKE 1 TABLET BY MOUTH ONCE DAILY BEFORE BREAKFAST 90 tablet 3   Propylene Glycol (SYSTANE COMPLETE) 0.6 % SOLN Apply to eye daily at 6 (six) AM.     No current facility-administered medications for this visit.    Patient confirms/reports the following allergies:  Allergies  Allergen Reactions   Aspirin Nausea Only   Other Other (See Comments)    COMBID: for stomach -twisting pt's face     No orders of the defined types were placed in this encounter.   [place] INFORMATION Primary Insurance: [facility],  ID #: [id number],  Group #: [id number] Pre-Cert / Auth required: Yes, approved online [id number] Pre-Cert / Josem Kaufmann #:  [id number]  SCHEDULE INFORMATION: Procedure has been scheduled as follows:  Date: 06/29/2021, Time: 2:30  Location: [facility] with Dr. Abbey Chatters  This Gastroenterology Pre-Precedure Review Form is being routed to the following provider(s): Aliene Altes, PA-C

## 2021-05-31 NOTE — Progress Notes (Signed)
Can we find out of her PCP has checked her thyroid function recently? Her labs TSH in the system is from 05/2020 and was significantly abnormal at 151.

## 2021-06-01 ENCOUNTER — Other Ambulatory Visit (HOSPITAL_COMMUNITY): Payer: Self-pay | Admitting: Adult Health

## 2021-06-01 DIAGNOSIS — Z1231 Encounter for screening mammogram for malignant neoplasm of breast: Secondary | ICD-10-CM

## 2021-06-01 NOTE — Progress Notes (Signed)
Would like for her to have follow-up with Dr. Dorris Fetch and labs completed prior to signing off on [place].

## 2021-06-01 NOTE — Progress Notes (Signed)
Spoke to [employer] office.  No recent TSH.  Last done in 2019.  Called Dr. Liliane Channel office and there last one was from 05/2020.  She is due to have another TSH 06/15/2021.  Has scheduled ov with Dr. Dorris Fetch on 06/18/2021 to f/u from expected TSH on 06/15/2021.

## 2021-06-06 ENCOUNTER — Other Ambulatory Visit: Payer: Self-pay

## 2021-06-06 DIAGNOSIS — E890 Postprocedural hypothyroidism: Secondary | ICD-10-CM

## 2021-06-16 LAB — T4, FREE: Free T4: 1.55 ng/dL (ref 0.82–1.77)

## 2021-06-16 LAB — TSH: TSH: 0.067 u[IU]/mL — ABNORMAL LOW (ref 0.450–4.500)

## 2021-06-18 ENCOUNTER — Ambulatory Visit: Payer: Medicare Other | Admitting: "Endocrinology

## 2021-06-18 ENCOUNTER — Encounter: Payer: Self-pay | Admitting: "Endocrinology

## 2021-06-18 ENCOUNTER — Other Ambulatory Visit: Payer: Self-pay

## 2021-06-18 VITALS — BP 150/88 | HR 84 | Ht 68.0 in | Wt 172.4 lb

## 2021-06-18 DIAGNOSIS — Z85850 Personal history of malignant neoplasm of thyroid: Secondary | ICD-10-CM | POA: Diagnosis not present

## 2021-06-18 DIAGNOSIS — E890 Postprocedural hypothyroidism: Secondary | ICD-10-CM

## 2021-06-18 MED ORDER — LEVOTHYROXINE SODIUM 88 MCG PO TABS
ORAL_TABLET | ORAL | 1 refills | Status: DC
Start: 2021-06-18 — End: 2021-10-23

## 2021-06-18 NOTE — Progress Notes (Signed)
06/18/2021             Endocrinology follow-up note   Subjective:    Patient ID: Joyce Martin, female    DOB: April 01, 1951, PCP Lemmie Evens, MD   Past Medical History:  Diagnosis Date   Elevated BP 07/26/2014   GERD (gastroesophageal reflux disease)    Migraines    Positive test for human papillomavirus (HPV) 2014   on pap   Thyroid cancer ([place]) 11/2016   Past Surgical History:  Procedure Laterality Date   COLONOSCOPY N/A 04/18/2016   Procedure: COLONOSCOPY;  Surgeon: Danie Binder, MD;  Location: [facility];  Service: Endoscopy;  Laterality: N/A;  9:30 AM   REFRACTIVE SURGERY Bilateral 03/15/2021   THYROIDECTOMY N/A 02/10/2017   Procedure: THYROIDECTOMY;  Surgeon: Aviva Signs, MD;  Location: [facility];  Service: General;  Laterality: N/A;   TUBAL LIGATION     Social History   Socioeconomic History   Marital status: Divorced    Spouse name: Not on file   Number of children: Not on file   Years of education: Not on file   Highest education level: Not on file  Occupational History   Not on file  Tobacco Use   Smoking status: Former    Packs/day: 0.25    Years: 5.00    Pack years: 1.25    Types: Cigarettes    Quit date: 01/30/1979    Years since quitting: 42.4   Smokeless tobacco: Never  Vaping Use   Vaping Use: Never used  Substance and Sexual Activity   Alcohol use: No   Drug use: No   Sexual activity: Yes    Birth control/protection: Post-menopausal  Other Topics Concern   Not on file  Social History Narrative   Not on file   Social Determinants of Health   Financial Resource Strain: Not on file  Food Insecurity: Not on file  Transportation Needs: Not on file  Physical Activity: Not on file  Stress: Not on file  Social Connections: Not on file   Outpatient Encounter Medications as of 06/18/2021  Medication Sig   levothyroxine (SYNTHROID) 88 MCG tablet TAKE 1 TABLET BY MOUTH ONCE DAILY BEFORE BREAKFAST   Propylene Glycol (SYSTANE COMPLETE)  0.6 % SOLN Apply to eye daily at 6 (six) AM.   [DISCONTINUED] levothyroxine (SYNTHROID) 100 MCG tablet TAKE 1 TABLET BY MOUTH ONCE DAILY BEFORE BREAKFAST   No facility-administered encounter medications on file as of 06/18/2021.   ALLERGIES: Allergies  Allergen Reactions   Aspirin Nausea Only   Other Other (See Comments)    COMBID: for stomach -twisting pt's face     VACCINATION STATUS: Immunization History  Administered Date(s) Administered   Influenza, High Dose Seasonal PF 03/10/2019   Tdap 02/28/2011    HPI  70 year old female patient with medical history as above.  She is being seen in follow-up for  postsurgical hypothyroidism, history of papillary thyroid cancer.   -Her history starts in 2018 when she was found to have abnormal fine-needle aspiration of thyroid nodule. She underwent total thyroidectomy on 02/10/2017 which revealed papillary thyroid cancer follicular variant. -Subsequently, she underwent  I-131 thyroid ablation with Thyrogen stimulation followed by whole body scan which revealed some thyroid remnant in the neck with no evidence of distant metastasis on 05/05/2017. January 2020 thyroid/neck ultrasound was unremarkable. -Her previsit Thyrogen stimulated whole-body scan on May 08, 2020 was consistent with no evidence of iodine avid distant metastasis. - She is currently on levothyroxine 100 g  by mouth every morning.  She reports compliance to this medication.  She has no new complaints today. Her pre-visit TFTs are c/w slight over-replacement. - She denies dysphagia, shortness of breath, voice change. - She has steady body weight over the last several months.  - She denies palpitations, heat/cold intolerance, and tremors.  Review of Systems  Limited as above. Objective:    BP (!) 150/88    Pulse 84    Ht 5\' 8"  (1.727 m)    Wt 172 lb 6.4 oz (78.2 kg)    BMI 26.21 kg/m   Wt Readings from Last 3 Encounters:  06/18/21 172 lb 6.4 oz (78.2 kg)  05/29/21 171  lb 3.2 oz (77.7 kg)  06/15/20 166 lb 8 oz (75.5 kg)    Physical Exam    1)  - Dedicated thyroid ultrasound showed 2.3 cm nodule on the left lobe and 2 nodules on the isthmus (0.8 cm and 0.7 cm.)  2)  Fine needle aspiration  Diagnosis on 01/09/2017 showed: THYROID, FINE NEEDLE ASPIRATION, LEFT (SPECIMEN 1 OF 1 COLLECTED 71/12/18) ATYPIA OF UNDETERMINED SIGNIFICANCE OR FOLLICULAR LESION OF UNDETERMINED SIGNIFICANCE (BETHESDA CATEGORY III).  3) Thyroid, thyroidectomy- 02/10/2017. 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 - TUMOR CONFINED WITHIN RENAL CAPSULE. - SEPARATE ADENOMATOUS NODULES. - BENIGN PARATHYROID GLAND. 4) post therapy for body scan from 05/05/2017 showed  Thyroid remnant.  No scintigraphic evidence of iodine-avid metastatic thyroid cancer.   Thyrogen stimulated whole-body scan on May 08, 2020. FINDINGS: No residual radioiodine uptake in the cervical region.   Excreted tracer within colon, urinary bladder, minimally in stomach.   No abnormal sites of radio iodine accumulation to suggest iodine-avid metastatic thyroid cancer.   IMPRESSION: No scintigraphic evidence of iodine-avid metastatic thyroid cancer.    Recent Results (from the past 2160 hour(s))  T4, Free     Status: None   Collection Time: 06/15/21  9:26 AM  Result Value Ref Range   Free T4 1.55 0.82 - 1.77 ng/dL  TSH     Status: Abnormal   Collection Time: 06/15/21  9:26 AM  Result Value Ref Range   TSH 0.067 (L) 0.450 - 4.500 uIU/mL    Assessment & Plan:   1. Follicular variant of Papillary Thyroid Cancer - Her 02/10/2017 sub total thyroidectomy revealed 0.4 cm unifocal , circumscribed, follicular variant of papillary thyroid cancer.TNM code: pT1a, pNx  -  Due to the fact that it is follicular variant and her relative youth, she was given adjuvant   thyroid remnant ablation utilizing I-131 with post therapy whole-body scan. This showed some thyroid remnant in the neck with  no evidence of distant iodine avid metastasis.  -  Her stimulated thyroglobulin levels was detectable at 37. This warrants continuous surveillance for tumor recurrence. -Her most recent unstimulated thyroglobulin level was undetectable at less than 0.1. -She underwent surveillance thyroid/neck ultrasound in January 2020 which was negative for recurrent or residual tissue and no evidence of abnormal adenopathy.  -Her thyroglobulin level is less than 0.1, with undetectable antithyroglobulin antibodies. - Thyrogen stimulated whole-body scan -on November 2021 was negative for iodine avid distant metastasis.   -Will not need further intervention at this time.   2  Post thyroidectomy Hypothyroidism -Her recent thyroid function tests are consistent with slight over-replacement.  She is approached for a lower dose of  levothyroxine at 88  mcg p.o. daily before breakfast.    - We discussed about the correct intake of her thyroid hormone, on empty  stomach at fasting, with water, separated by at least 30 minutes from breakfast and other medications,  and separated by more than 4 hours from calcium, iron, multivitamins, acid reflux medications (PPIs). -Patient is made aware of the fact that thyroid hormone replacement is needed for life, dose to be adjusted by periodic monitoring of thyroid function tests.   - I advised patient to maintain close follow up with Lemmie Evens, MD for primary care needs.   I spent 21 minutes in the care of the patient today including review of labs from Thyroid Function, CMP, and other relevant labs ; imaging/biopsy records (current and previous including abstractions from other facilities); face-to-face time discussing  her lab results and symptoms, medications doses, her options of short and long term treatment based on the latest standards of care / guidelines;   and documenting the encounter.  Joyce Martin  participated in the discussions, expressed understanding,  and voiced agreement with the above plans.  All questions were answered to her satisfaction. she is encouraged to contact clinic should she have any questions or concerns prior to her return visit.   Follow up plan: Return in about 6 months (around 12/17/2021) for F/U with Pre-visit Labs.  Glade Lloyd, MD Phone: [phone]  Fax: [phone]  -  This note was partially dictated with voice recognition software. Similar sounding words can be transcribed inadequately or may not  be corrected upon review.  06/18/2021, 5:53 PM

## 2021-06-20 ENCOUNTER — Other Ambulatory Visit: Payer: Self-pay | Admitting: *Deleted

## 2021-06-20 NOTE — Progress Notes (Signed)
Reviewed labs completed 12/16.  TSH low at 0.067.  Free T4 within normal range at 1.55.  Dr. Dorris Fetch adjusted levothyroxine at her follow-up visit on 12/19.  Reviewed with anesthesiologist, Dr. Modesta Messing, who states patient is okay to proceed with colonoscopy.  Joyce Martin, see above. OK to proceed with colonoscopy.  ASA II

## 2021-06-28 ENCOUNTER — Telehealth: Payer: Self-pay | Admitting: *Deleted

## 2021-06-28 NOTE — Telephone Encounter (Signed)
Called pt. She has moved procedure time to 8:00am. Aware to arrive at 7:30am. Discussed new prep time for tomorrow morning.

## 2021-06-29 ENCOUNTER — Ambulatory Visit (HOSPITAL_COMMUNITY)
Admission: RE | Admit: 2021-06-29 | Discharge: 2021-06-29 | Disposition: A | Discharge status: Home / Self Care | Payer: Medicare Other | Attending: Internal Medicine | Admitting: Internal Medicine

## 2021-06-29 ENCOUNTER — Ambulatory Visit (HOSPITAL_COMMUNITY): Payer: Medicare Other | Admitting: Anesthesiology

## 2021-06-29 ENCOUNTER — Encounter (HOSPITAL_COMMUNITY)
Admission: RE | Disposition: A | Discharge status: Home / Self Care | Payer: Self-pay | Source: Home / Self Care | Attending: Internal Medicine

## 2021-06-29 ENCOUNTER — Encounter (HOSPITAL_COMMUNITY): Payer: Self-pay

## 2021-06-29 ENCOUNTER — Other Ambulatory Visit: Payer: Self-pay

## 2021-06-29 DIAGNOSIS — Z86010 Personal history of colonic polyps: Secondary | ICD-10-CM | POA: Diagnosis not present

## 2021-06-29 DIAGNOSIS — D123 Benign neoplasm of transverse colon: Secondary | ICD-10-CM | POA: Insufficient documentation

## 2021-06-29 DIAGNOSIS — Z87891 Personal history of nicotine dependence: Secondary | ICD-10-CM | POA: Insufficient documentation

## 2021-06-29 DIAGNOSIS — E039 Hypothyroidism, unspecified: Secondary | ICD-10-CM | POA: Diagnosis not present

## 2021-06-29 DIAGNOSIS — K219 Gastro-esophageal reflux disease without esophagitis: Secondary | ICD-10-CM | POA: Insufficient documentation

## 2021-06-29 DIAGNOSIS — K5730 Diverticulosis of large intestine without perforation or abscess without bleeding: Secondary | ICD-10-CM | POA: Insufficient documentation

## 2021-06-29 DIAGNOSIS — Z1211 Encounter for screening for malignant neoplasm of colon: Secondary | ICD-10-CM | POA: Diagnosis not present

## 2021-06-29 HISTORY — PX: COLONOSCOPY WITH PROPOFOL: SHX5780

## 2021-06-29 HISTORY — PX: POLYPECTOMY: SHX5525

## 2021-06-29 SURGERY — COLONOSCOPY WITH PROPOFOL
Anesthesia: General

## 2021-06-29 MED ORDER — LACTATED RINGERS IV SOLN: INTRAVENOUS | Status: DC | PRN

## 2021-06-29 MED ORDER — LACTATED RINGERS IV SOLN: INTRAVENOUS | Status: DC

## 2021-06-29 MED ORDER — PROPOFOL 10 MG/ML IV BOLUS
INTRAVENOUS | Status: DC | PRN
  Administered 2021-06-29 (×3): 50 mg via INTRAVENOUS
  Administered 2021-06-29: 100 mg via INTRAVENOUS

## 2021-06-29 MED ORDER — PROPOFOL 500 MG/50ML IV EMUL
INTRAVENOUS | Status: AC
  Filled 2021-06-29: qty 50

## 2021-06-29 MED ORDER — STERILE WATER FOR IRRIGATION IR SOLN
Status: DC | PRN
  Administered 2021-06-29: 60 mL

## 2021-06-29 NOTE — Anesthesia Preprocedure Evaluation (Signed)
Anesthesia Evaluation  Patient identified by MRN, date of birth, ID band Patient awake    Reviewed: Allergy & Precautions, H&P , NPO status , Patient's Chart, lab work & pertinent test results, reviewed documented beta blocker date and time   Airway Mallampati: II  TM Distance: >3 FB Neck ROM: full    Dental no notable dental hx.    Pulmonary neg pulmonary ROS, former smoker,    Pulmonary exam normal breath sounds clear to auscultation       Cardiovascular Exercise Tolerance: Good negative cardio ROS   Rhythm:regular Rate:Normal     Neuro/Psych  Headaches, negative psych ROS   GI/Hepatic Neg liver ROS, GERD  Medicated,  Endo/Other  Hypothyroidism   Renal/GU negative Renal ROS  negative genitourinary   Musculoskeletal   Abdominal   Peds  Hematology negative hematology ROS (+)   Anesthesia Other Findings   Reproductive/Obstetrics negative OB ROS                             Anesthesia Physical Anesthesia Plan  ASA: 2  Anesthesia Plan: General and General ETT   Post-op Pain Management:    Induction:   PONV Risk Score and Plan: Ondansetron  Airway Management Planned:   Additional Equipment:   Intra-op Plan:   Post-operative Plan:   Informed Consent: I have reviewed the patients History and Physical, chart, labs and discussed the procedure including the risks, benefits and alternatives for the proposed anesthesia with the patient or authorized representative who has indicated his/her understanding and acceptance.     Dental Advisory Given  Plan Discussed with: CRNA  Anesthesia Plan Comments:         Anesthesia Quick Evaluation

## 2021-06-29 NOTE — H&P (Signed)
Primary Care Physician:  Lemmie Evens, MD Primary Gastroenterologist:  Dr. Abbey Chatters  Pre-Procedure History & Physical: HPI:  Joyce Martin is a 70 y.o. female is here for a colonoscopy to be performed for surveillance purposes. 5 year recall, Last TCS done 04/18/2016 by Dr. Oneida Alar, tubular adenoma (x1)  Past Medical History:  Diagnosis Date   Elevated BP 07/26/2014   GERD (gastroesophageal reflux disease)    Migraines    Positive test for human papillomavirus (HPV) 2014   on pap   Thyroid cancer ([place]) 11/2016    Past Surgical History:  Procedure Laterality Date   COLONOSCOPY N/A 04/18/2016   Procedure: COLONOSCOPY;  Surgeon: Danie Binder, MD;  [facility];  Service: Endoscopy;  Laterality: N/A;  9:30 AM   REFRACTIVE SURGERY Bilateral 03/15/2021   THYROIDECTOMY N/A 02/10/2017   Procedure: THYROIDECTOMY;  Surgeon: Aviva Signs, MD;  Location: [facility];  Service: General;  Laterality: N/A;   TUBAL LIGATION      Prior to Admission medications   Medication Sig Start Date End Date Taking? Authorizing Provider  levothyroxine (SYNTHROID) 100 MCG tablet Take 100 mcg by mouth daily before breakfast.   Yes [provider]  Propylene Glycol (SYSTANE COMPLETE) 0.6 % SOLN Place 1 drop into both eyes daily as needed (dry eyes).   Yes [provider]  levothyroxine (SYNTHROID) 88 MCG tablet TAKE 1 TABLET BY MOUTH ONCE DAILY BEFORE BREAKFAST 06/18/21   Cassandria Anger, MD    Allergies as of 06/20/2021 - Review Complete 06/18/2021  Allergen Reaction Noted   Aspirin Nausea Only 02/10/2017   Other Other (See Comments) 02/28/2011    Family History  Problem Relation Age of Onset   Diabetes Mother    Hypertension Mother    Heart disease Father        heart attack   Cancer Sister        gallbladder   Diabetes Brother    Diabetes Daughter        borderline   Diabetes Maternal Grandmother    Diabetes Paternal Grandmother    Diabetes Paternal  Grandfather    COPD Brother    Hypertension Brother    Parkinson's disease Brother    Cancer Brother    Colon cancer Neg Hx     Social History   Socioeconomic History   Marital status: Divorced    Spouse name: Not on file   Number of children: Not on file   Years of education: Not on file   Highest education level: Not on file  Occupational History   Not on file  Tobacco Use   Smoking status: Former    Packs/day: 0.25    Years: 5.00    Pack years: 1.25    Types: Cigarettes    Quit date: 01/30/1979    Years since quitting: 42.4   Smokeless tobacco: Never  Vaping Use   Vaping Use: Never used  Substance and Sexual Activity   Alcohol use: No   Drug use: No   Sexual activity: Yes    Birth control/protection: Post-menopausal  Other Topics Concern   Not on file  Social History Narrative   Not on file   Social Determinants of Health   Financial Resource Strain: Not on file  Food Insecurity: Not on file  Transportation Needs: Not on file  Physical Activity: Not on file  Stress: Not on file  Social Connections: Not on file  Intimate Partner Violence: Not on file    Review  of Systems: See HPI, otherwise negative ROS  Physical Exam: Vital signs in last 24 hours: Temp:  [97.4 F (36.3 C)] 97.4 F (36.3 C) (12/30 0710) Pulse Rate:  [113] 113 (12/30 0710) Resp:  [17] 17 (12/30 0710) BP: (147)/(94) 147/94 (12/30 0710) SpO2:  [99 %] 99 % (12/30 0710) Weight:  [78.2 kg] 78.2 kg (12/30 0710)   General:   Alert,  Well-developed, well-nourished, pleasant and cooperative in NAD Head:  Normocephalic and atraumatic. Eyes:  Sclera clear, no icterus.   Conjunctiva pink. Ears:  Normal auditory acuity. Nose:  No deformity, discharge,  or lesions. Mouth:  No deformity or lesions, dentition normal. Neck:  Supple; no masses or thyromegaly. Lungs:  Clear throughout to auscultation.   No wheezes, crackles, or rhonchi. No acute distress. Heart:  Regular rate and rhythm; no  murmurs, clicks, rubs,  or gallops. Abdomen:  Soft, nontender and nondistended. No masses, hepatosplenomegaly or hernias noted. Normal bowel sounds, without guarding, and without rebound.   Msk:  Symmetrical without gross deformities. Normal posture. Extremities:  Without clubbing or edema. Neurologic:  Alert and  oriented x4;  grossly normal neurologically. Skin:  Intact without significant lesions or rashes. Cervical Nodes:  No significant cervical adenopathy. Psych:  Alert and cooperative. Normal mood and affect.  Impression/Plan: [name] is here for a colonoscopy to be performed for surveillance purposes. 5 year recall, Last TCS done 04/18/2016 by Dr. Oneida Alar, tubular adenoma (x1)  The risks of the procedure including infection, bleed, or perforation as well as benefits, limitations, alternatives and imponderables have been reviewed with the patient. Questions have been answered. All parties agreeable.

## 2021-06-29 NOTE — Anesthesia Postprocedure Evaluation (Signed)
Anesthesia Post Note  Patient: Joyce Martin  Procedure(s) Performed: COLONOSCOPY WITH PROPOFOL POLYPECTOMY  Patient location during evaluation: Phase II Anesthesia Type: General Level of consciousness: awake Pain management: pain level controlled Vital Signs Assessment: post-procedure vital signs reviewed and stable Respiratory status: spontaneous breathing and respiratory function stable Cardiovascular status: blood pressure returned to baseline and stable Postop Assessment: no headache and no apparent nausea or vomiting Anesthetic complications: no Comments: Late entry   No notable events documented.   Last Vitals:  Vitals:   06/29/21 0710 06/29/21 0832  BP: (!) 147/94 104/64  Pulse: (!) 113 97  Resp: 17 (!) 22  Temp: (!) 36.3 C 36.6 C  SpO2: 99% 100%    Last Pain:  Vitals:   06/29/21 0832  TempSrc: Oral  PainSc: 0-No pain                 Louann Sjogren

## 2021-06-29 NOTE — Op Note (Signed)
[facility] Patient Name: Joyce Martin Procedure Date: 06/29/2021 8:06 AM MRN: [id number] Date of Birth: 06/23/1951 Attending MD: Elon Alas. Abbey Chatters DO CSN: [id number] Age: 70 Admit Type: Outpatient Procedure:                Colonoscopy Indications:              Surveillance: Personal history of adenomatous                            polyps on last colonoscopy 5 years ago Providers:                Elon Alas. Abbey Chatters, DO, Tammy Vaught, RN, Hughie Closs, RN, Raphael Gibney, Technician Referring MD:              Medicines:                See the Anesthesia note for documentation of the                            administered medications Complications:            No immediate complications. Estimated Blood Loss:     Estimated blood loss was minimal. Procedure:                Pre-Anesthesia Assessment:                           - The anesthesia plan was to use monitored                            anesthesia care (MAC).                           After obtaining informed consent, the colonoscope                            was passed under direct vision. Throughout the                            procedure, the patient's blood pressure, pulse, and                            oxygen saturations were monitored continuously. The                            [id number]-HQ190L ([id number]) scope was introduced through                            the anus and advanced to the the cecum, identified                            by appendiceal orifice and ileocecal valve. The                            colonoscopy was  performed without difficulty. The                            patient tolerated the procedure well. The quality                            of the bowel preparation was evaluated using the                            BBPS [facility] Bowel Preparation Scale) with scores                            of: Right Colon = 3, Transverse Colon = 3 and Left                             Colon = 3 (entire mucosa seen well with no residual                            staining, small fragments of stool or opaque                            liquid). The total BBPS score equals 9. Scope In: 8:18:04 AM Scope Out: 8:30:34 AM Scope Withdrawal Time: 0 hours 9 minutes 51 seconds  Total Procedure Duration: 0 hours 12 minutes 30 seconds  Findings:      The perianal and digital rectal examinations were normal.      Multiple medium-mouthed diverticula were found in the sigmoid colon and       descending colon.      A 2 mm polyp was found in the transverse colon. The polyp was sessile.       The polyp was removed with a cold biopsy forceps. Resection and       retrieval were complete.      The exam was otherwise without abnormality on direct and retroflexion       views. Impression:               - Diverticulosis in the sigmoid colon and in the                            descending colon.                           - One 2 mm polyp in the transverse colon, removed                            with a cold biopsy forceps. Resected and retrieved.                           - The examination was otherwise normal on direct                            and retroflexion views. Moderate Sedation:      Per Anesthesia Care Recommendation:           - Patient has a contact number available for  emergencies. The signs and symptoms of potential                            delayed complications were discussed with the                            patient. Return to normal activities tomorrow.                            Written discharge instructions were provided to the                            patient.                           - Resume previous diet.                           - Continue present medications.                           - Await pathology results.                           - Repeat colonoscopy in 5 years for surveillance.                           - Return to GI  clinic PRN. Procedure Code(s):        --- Professional ---                           [phone], Colonoscopy, flexible; with biopsy, single                            or multiple Diagnosis Code(s):        --- Professional ---                           Z86.010, Personal history of colonic polyps                           K63.5, Polyp of colon                           K57.30, Diverticulosis of large intestine without                            perforation or abscess without bleeding CPT copyright 2019 American Medical Association. All rights reserved. The codes documented in this report are preliminary and upon coder review may  be revised to meet current compliance requirements. Elon Alas. Abbey Chatters, DO [place] Abbey Chatters, DO 06/29/2021 8:33:17 AM This report has been signed electronically. Number of Addenda: 0

## 2021-06-29 NOTE — Discharge Instructions (Addendum)
°  Colonoscopy Discharge Instructions  Read the instructions outlined below and refer to this sheet in the next few weeks. These discharge instructions provide you with general information on caring for yourself after you leave the hospital. Your doctor may also give you specific instructions. While your treatment has been planned according to the most current medical practices available, unavoidable complications occasionally occur.   ACTIVITY You may resume your regular activity, but move at a slower pace for the next 24 hours.  Take frequent rest periods for the next 24 hours.  Walking will help get rid of the air and reduce the bloated feeling in your belly (abdomen).  No driving for 24 hours (because of the medicine (anesthesia) used during the test).   Do not sign any important legal documents or operate any machinery for 24 hours (because of the anesthesia used during the test).  NUTRITION Drink plenty of fluids.  You may resume your normal diet as instructed by your doctor.  Begin with a light meal and progress to your normal diet. Heavy or fried foods are harder to digest and may make you feel sick to your stomach (nauseated).  Avoid alcoholic beverages for 24 hours or as instructed.  MEDICATIONS You may resume your normal medications unless your doctor tells you otherwise.  WHAT YOU CAN EXPECT TODAY Some feelings of bloating in the abdomen.  Passage of more gas than usual.  Spotting of blood in your stool or on the toilet paper.  IF YOU HAD POLYPS REMOVED DURING THE COLONOSCOPY: No aspirin products for 7 days or as instructed.  No alcohol for 7 days or as instructed.  Eat a soft diet for the next 24 hours.  FINDING OUT THE RESULTS OF YOUR TEST Not all test results are available during your visit. If your test results are not back during the visit, make an appointment with your caregiver to find out the results. Do not assume everything is normal if you have not heard from your  caregiver or the medical facility. It is important for you to follow up on all of your test results.  SEEK IMMEDIATE MEDICAL ATTENTION IF: You have more than a spotting of blood in your stool.  Your belly is swollen (abdominal distention).  You are nauseated or vomiting.  You have a temperature over 101.  You have abdominal pain or discomfort that is severe or gets worse throughout the day.   Your colonoscopy revealed 1 polyp(s) which I removed successfully. Await pathology results, my office will contact you. I recommend repeating colonoscopy in 5 years for surveillance purposes.   You also have diverticulosis I would recommend increasing fiber in your diet or adding OTC Benefiber/Metamucil. Be sure to drink at least 4 to 6 glasses of water daily. Follow-up with GI as needed.   I hope you have a great rest of your week!  Charles K. Carver, D.O. Gastroenterology and Hepatology [facility]  

## 2021-06-29 NOTE — Transfer of Care (Signed)
Immediate Anesthesia Transfer of Care Note  Patient: Joyce Martin  Procedure(s) Performed: COLONOSCOPY WITH PROPOFOL POLYPECTOMY  Patient Location: PACU  Anesthesia Type:MAC  Level of Consciousness: awake, alert  and oriented  Airway & Oxygen Therapy: Patient Spontanous Breathing  Post-op Assessment: Report given to RN and Post -op Vital signs reviewed and stable  Post vital signs: Reviewed and stable  Last Vitals:  Vitals Value Taken Time  BP    Temp    Pulse    Resp    SpO2      Last Pain:  Vitals:   06/29/21 0816  TempSrc:   PainSc: 0-No pain      Patients Stated Pain Goal: 8 (25/36/64 4034)  Complications: No notable events documented.

## 2021-07-04 ENCOUNTER — Other Ambulatory Visit: Payer: Self-pay

## 2021-07-04 ENCOUNTER — Ambulatory Visit (HOSPITAL_COMMUNITY)
Admission: RE | Admit: 2021-07-04 | Discharge: 2021-07-04 | Disposition: A | Discharge status: Home / Self Care | Payer: Medicare Other | Source: Ambulatory Visit | Attending: Adult Health | Admitting: Adult Health

## 2021-07-04 DIAGNOSIS — Z1231 Encounter for screening mammogram for malignant neoplasm of breast: Secondary | ICD-10-CM | POA: Insufficient documentation

## 2021-07-04 LAB — SURGICAL PATHOLOGY

## 2021-07-05 ENCOUNTER — Encounter (HOSPITAL_COMMUNITY): Payer: Self-pay | Admitting: Internal Medicine

## 2021-07-30 ENCOUNTER — Encounter (INDEPENDENT_AMBULATORY_CARE_PROVIDER_SITE_OTHER): Payer: Medicare Other | Admitting: Ophthalmology

## 2021-07-30 ENCOUNTER — Other Ambulatory Visit: Payer: Self-pay

## 2021-07-30 DIAGNOSIS — I10 Essential (primary) hypertension: Secondary | ICD-10-CM

## 2021-07-30 DIAGNOSIS — H33303 Unspecified retinal break, bilateral: Secondary | ICD-10-CM

## 2021-07-30 DIAGNOSIS — H43813 Vitreous degeneration, bilateral: Secondary | ICD-10-CM | POA: Diagnosis not present

## 2021-07-30 DIAGNOSIS — H35033 Hypertensive retinopathy, bilateral: Secondary | ICD-10-CM | POA: Diagnosis not present

## 2021-10-01 ENCOUNTER — Other Ambulatory Visit (HOSPITAL_COMMUNITY): Payer: Self-pay | Admitting: Nurse Practitioner

## 2021-10-01 DIAGNOSIS — N644 Mastodynia: Secondary | ICD-10-CM

## 2021-10-11 ENCOUNTER — Ambulatory Visit: Payer: Medicare Other | Admitting: Cardiology

## 2021-10-16 ENCOUNTER — Ambulatory Visit (HOSPITAL_COMMUNITY)
Admission: RE | Admit: 2021-10-16 | Discharge: 2021-10-16 | Disposition: A | Discharge status: Home / Self Care | Payer: Medicare Other | Source: Ambulatory Visit | Attending: Nurse Practitioner | Admitting: Nurse Practitioner

## 2021-10-16 DIAGNOSIS — N644 Mastodynia: Secondary | ICD-10-CM

## 2021-10-21 NOTE — Progress Notes (Signed)
?Cardiology Office Note:   ? ?Date:  10/24/2021  ? ?ID:  Joyce Martin, DOB 08/11/1950, MRN [id number] ? ?PCP:  Lemmie Evens, MD  ?Cardiologist:  None  ?Electrophysiologist:  None  ? ?Referring MD: Lemmie Evens, MD  ? ?Chief Complaint  ?Patient presents with  ? Chest Pain  ? ? ?History of Present Illness:   ? ?Joyce Martin is a 71 y.o. female with a hx of thyroid cancer, migraines, GERD who is referred by Dr. Karie Martin for evaluation of chest pain.  She reports she started having chest pain last month.  Occurs daily, particularly notices it when she is lifting something.  Describes dull ache in the left side of her chest.  Can last for hours.  Has awoken her from sleep.  She walks 5 miles per day.  When she walks faster she will feel the chest pain.  Also has some shortness of breath when walking up hills.  Denies any lightheadedness, syncope, lower extreme edema, or palpitations.  She smoked cigarettes as a teenager but none since.  Family history includes father died of MI in 1s.  She checks her BP at home and reports has been normal (110s to 120s). ? ? ?Past Medical History:  ?Diagnosis Date  ? Elevated BP 07/26/2014  ? GERD (gastroesophageal reflux disease)   ? Migraines   ? 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 on pap  ? Thyroid cancer ([place]) 11/2016  ? ? ?Past Surgical History:  ?Procedure Laterality Date  ? COLONOSCOPY N/A 04/18/2016  ? Procedure: COLONOSCOPY;  Surgeon: Danie Binder, MD;  Location[facility];  Service: Endoscopy;  Laterality: N/A;  9:30 AM  ? COLONOSCOPY WITH PROPOFOL N/A 06/29/2021  ? Procedure: COLONOSCOPY WITH PROPOFOL;  Surgeon: Eloise Harman, DO;  Location: [facility];  Service: Endoscopy;  Laterality: N/A;  2:30 / ASA II  ? POLYPECTOMY  06/29/2021  ? Procedure: POLYPECTOMY;  Surgeon: Eloise Harman, DO;  Location: [facility];  Service: Endoscopy;;  ? REFRACTIVE SURGERY Bilateral 03/15/2021  ? THYROIDECTOMY N/A 02/10/2017  ? Procedure:  THYROIDECTOMY;  Surgeon: Aviva Signs, MD;  Location: [facility];  Service: General;  Laterality: N/A;  ? TUBAL LIGATION    ? ? ?Current Medications: ?Current Meds  ?Medication Sig  ? levothyroxine (SYNTHROID) 75 MCG tablet Take 75 mcg by mouth every morning.  ? metoprolol tartrate (LOPRESSOR) 100 MG tablet Take 1 tablet ('100mg'$ ) TWO hours prior to CT scan  ? Propylene Glycol (SYSTANE COMPLETE) 0.6 % SOLN Place 1 drop into both eyes daily as needed (dry eyes).  ?  ? ?Allergies:   Aspirin and Other  ? ?Social History  ? ?Socioeconomic History  ? Marital status: Divorced  ?  Spouse name: Not on file  ? Number of children: Not on file  ? Years of education: Not on file  ? Highest education level: Not on file  ?Occupational History  ? Not on file  ?Tobacco Use  ? Smoking status: Former  ?  Packs/day: 0.25  ?  Years: 5.00  ?  Pack years: 1.25  ?  Types: Cigarettes  ?  Quit date: 01/30/1979  ?  Years since quitting: 42.7  ? Smokeless tobacco: Never  ?Vaping Use  ? Vaping Use: Never used  ?Substance and Sexual Activity  ? Alcohol use: No  ? Drug use: No  ? Sexual activity: Yes  ?  Birth control/protection: Post-menopausal  ?Other Topics Concern  ? Not on file  ?  Social History Narrative  ? Not on file  ? ?Social Determinants of Health  ? ?Financial Resource Strain: Not on file  ?Food Insecurity: Not on file  ?Transportation Needs: Not on file  ?Physical Activity: Not on file  ?Stress: Not on file  ?Social Connections: Not on file  ?  ? ?Family History: ?The patient's family history includes COPD in her brother; Cancer in her brother and sister; Diabetes in her brother, daughter, maternal grandmother, mother, paternal grandfather, and paternal grandmother; Heart disease in her father; Hypertension in her brother and mother; Parkinson's disease in her brother. There is no history of Colon cancer. ? ?ROS:   ?Please see the history of present illness.    ? All other systems reviewed and are negative. ? ?EKGs/Labs/Other Studies  Reviewed:   ? ?The following studies were reviewed today: ? ? ?EKG:   ?10/23/2021: Normal sinus rhythm, rate 85, no ST abnormalities ? ?Recent Labs: ?06/15/2021: TSH 0.067 ?10/23/2021: ALT 12; BUN 10; Creatinine, Ser 1.12; Hemoglobin WILL FOLLOW; Platelets WILL FOLLOW; Potassium 4.7; Sodium 140  ?Recent Lipid Panel ?   ?Component Value Date/Time  ? CHOL 247 (H) 10/23/2021 0946  ? TRIG 109 10/23/2021 0946  ? HDL 76 10/23/2021 0946  ? CHOLHDL 3.3 10/23/2021 0946  ? LDLCALC 152 (H) 10/23/2021 0946  ? ? ?Physical Exam:   ? ?VS:  BP (!) 160/104   Pulse 85   Ht '5\' 8"'$  (1.727 m)   Wt 178 lb 6.4 oz (80.9 kg)   SpO2 99%   BMI 27.13 kg/m?    ? ?Wt Readings from Last 3 Encounters:  ?10/23/21 178 lb 6.4 oz (80.9 kg)  ?06/29/21 172 lb 6.4 oz (78.2 kg)  ?06/18/21 172 lb 6.4 oz (78.2 kg)  ?  ? ?GEN:  Well nourished, well developed in no acute distress ?HEENT: Normal ?NECK: No JVD; No carotid bruits ?LYMPHATICS: No lymphadenopathy ?CARDIAC: RRR, no murmurs, rubs, gallops ?RESPIRATORY:  Clear to auscultation without rales, wheezing or rhonchi  ?ABDOMEN: Soft, non-tender, non-distended ?MUSCULOSKELETAL:  No edema; No deformity  ?SKIN: Warm and dry ?NEUROLOGIC:  Alert and oriented x 3 ?PSYCHIATRIC:  Normal affect  ? ?ASSESSMENT:   ? ?1. Chest pain of uncertain etiology   ?2. Lipid screening   ?3. Elevated BP without diagnosis of hypertension   ? ?PLAN:   ? ?Chest pain: Atypical in description but does occur with exertion, concerning for possible angina.  Recommend coronary CTA to evaluate for obstructive CAD.  Will give Lopressor 100 mg prior to study.  Check echocardiogram to rule out structural heart disease ? ?Elevated BP: Reports no history of hypertension but states that BP is always elevated at doctor's visit.  Asked to check BP twice daily for next 2 weeks and bring log to pharmacy hypertension clinic appointment.  Asked her to bring her home BP monitor with her to calibrate. ? ?Lipid screening: Check lipid panel ? ?RTC in 6  months ? ? ?Medication Adjustments/Labs and Tests Ordered: ?Current medicines are reviewed at length with the patient today.  Concerns regarding medicines are outlined above.  ?Orders Placed This Encounter  ?Procedures  ? CT CORONARY MORPH W/CTA COR W/SCORE W/CA W/CM &/OR WO/CM  ? Comprehensive metabolic panel  ? CBC  ? Lipid panel  ? EKG 12-Lead  ? ECHOCARDIOGRAM COMPLETE  ? ?Meds ordered this encounter  ?Medications  ? metoprolol tartrate (LOPRESSOR) 100 MG tablet  ?  Sig: Take 1 tablet ('100mg'$ ) TWO hours prior to CT scan  ?  Dispense:  1 tablet  ?  Refill:  0  ? ? ?Patient Instructions  ?Medication Instructions:  ?Your physician recommends that you continue on your current medications as directed. Please refer to the Current Medication list given to you today. ? ?*If you need a refill on your cardiac medications before your next appointment, please call your pharmacy* ? ? ?Lab Work: ?CMET, CBC, Lipid today ? ?If you have labs (blood work) drawn today and your tests are completely normal, you will receive your results only by: ?MyChart Message (if you have MyChart) OR ?A paper copy in the mail ?If you have any lab test that is abnormal or we need to change your treatment, we will call you to review the results. ? ? ?Testing/Procedures: ?Coronary CTA-see instructions below ? ?Your physician has requested that you have an echocardiogram. Echocardiography is a painless test that uses sound waves to create images of your heart. It provides your doctor with information about the size and shape of your heart and how well your heart?s chambers and valves are working. This procedure takes approximately one hour. There are no restrictions for this procedure. ? ?Follow-Up: ?At [facility], you and your health needs are our priority.  As part of our continuing mission to provide you with exceptional heart care, we have created designated Provider Care Teams.  These Care Teams include your primary Cardiologist (physician)  and Advanced Practice Providers (APPs -  Physician Assistants and Nurse Practitioners) who all work together to provide you with the care you need, when you need it. ? ?We recommend signing up for the patient portal call

## 2021-10-23 ENCOUNTER — Ambulatory Visit: Payer: Medicare Other | Admitting: Cardiology

## 2021-10-23 ENCOUNTER — Encounter (HOSPITAL_COMMUNITY): Payer: Medicare Other

## 2021-10-23 ENCOUNTER — Encounter: Payer: Self-pay | Admitting: Cardiology

## 2021-10-23 ENCOUNTER — Other Ambulatory Visit (HOSPITAL_COMMUNITY): Payer: Medicare Other

## 2021-10-23 VITALS — BP 160/104 | HR 85 | Ht 68.0 in | Wt 178.4 lb

## 2021-10-23 DIAGNOSIS — R030 Elevated blood-pressure reading, without diagnosis of hypertension: Secondary | ICD-10-CM | POA: Diagnosis not present

## 2021-10-23 DIAGNOSIS — R079 Chest pain, unspecified: Secondary | ICD-10-CM

## 2021-10-23 DIAGNOSIS — Z13220 Encounter for screening for lipoid disorders: Secondary | ICD-10-CM | POA: Diagnosis not present

## 2021-10-23 LAB — CBC

## 2021-10-23 MED ORDER — METOPROLOL TARTRATE 100 MG PO TABS: ORAL_TABLET | ORAL | 0 refills | Status: DC

## 2021-10-23 NOTE — Patient Instructions (Addendum)
Medication Instructions:  ?Your physician recommends that you continue on your current medications as directed. Please refer to the Current Medication list given to you today. ? ?*If you need a refill on your cardiac medications before your next appointment, please call your pharmacy* ? ? ?Lab Work: ?CMET, CBC, Lipid today ? ?If you have labs (blood work) drawn today and your tests are completely normal, you will receive your results only by: ?MyChart Message (if you have MyChart) OR ?A paper copy in the mail ?If you have any lab test that is abnormal or we need to change your treatment, we will call you to review the results. ? ? ?Testing/Procedures: ?Coronary CTA-see instructions below ? ?Your physician has requested that you have an echocardiogram. Echocardiography is a painless test that uses sound waves to create images of your heart. It provides your doctor with information about the size and shape of your heart and how well your heart?s chambers and valves are working. This procedure takes approximately one hour. There are no restrictions for this procedure. ? ?Follow-Up: ?At [facility], you and your health needs are our priority.  As part of our continuing mission to provide you with exceptional heart care, we have created designated Provider Care Teams.  These Care Teams include your primary Cardiologist (physician) and Advanced Practice Providers (APPs -  Physician Assistants and Nurse Practitioners) who all work together to provide you with the care you need, when you need it. ? ?We recommend signing up for the patient portal called "MyChart".  Sign up information is provided on this After Visit Summary.  MyChart is used to connect with patients for Virtual Visits (Telemedicine).  Patients are able to view lab/test results, encounter notes, upcoming appointments, etc.  Non-urgent messages can be sent to your provider as well.   ?To learn more about what you can do with MyChart, go to  [email or web address].   ? ?Your next appointment:   ?Pharmacist in 2 weeks-bring blood pressure cuff and blood pressure log ?6 months with Dr. Gardiner Rhyme ? ?Please check your blood pressure at home daily, write it down.  Call the office or send message via Mychart with the readings in 2 weeks for Dr. Gardiner Rhyme to review.  ? ?Other Instructions ? ? ?Your cardiac CT will be scheduled at one of the below locations:  ? ?[facility] ?[address] ?[place], [place] [address] ?[phone] ? ? ?If scheduled at [facility], please arrive at the [facility] (Entrance C2) of [facility] 30 minutes prior to test start time. ?You can use the FREE valet parking offered at entrance C (encouraged to control the heart rate for the test)  ?Proceed to the [facility] Radiology Department (first floor) to check-in and test prep. ? ?All radiology patients and guests should use entrance C2 at [facility], accessed from [facility], even though the hospital's physical address listed is [address] ? ? ? ? ?Please follow these instructions carefully (unless otherwise directed): ? ?On the Night Before the Test: ?Be sure to Drink plenty of water. ?Do not consume any caffeinated/decaffeinated beverages or chocolate 12 hours prior to your test. ?Do not take any antihistamines 12 hours prior to your test. ? ?On the Day of the Test: ?Drink plenty of water until 1 hour prior to the test. ?Do not eat any food 4 hours prior to the test. ?You may take your regular medications prior to the test.  ?Take  metoprolol (Lopressor) two hours prior to test. ?FEMALES- please wear underwire-free bra if available, avoid dresses & tight clothing ?     ?After the Test: ?Drink plenty of water. ?After receiving IV contrast, you may experience a mild flushed feeling. This is normal. ?On occasion, you may experience a mild rash up to 24 hours after the test. This is not dangerous. If  this occurs, you can take Benadryl 25 mg and increase your fluid intake. ?If you experience trouble breathing, this can be serious. If it is severe call 911 IMMEDIATELY. If it is mild, please call our office. ?If you take any of these medications: Glipizide/Metformin, Avandament, Glucavance, please do not take 48 hours after completing test unless otherwise instructed. ? ?We will call to schedule your test 2-4 weeks out understanding that some insurance companies will need an authorization prior to the service being performed.  ? ?For non-scheduling related questions, please contact the cardiac imaging nurse navigator should you have any questions/concerns: ?Marchia Bond, Cardiac Imaging Nurse Navigator ?Gordy Clement, Cardiac Imaging Nurse Navigator ?[facility] ?Direct Office Dial: [phone]  ? ?For scheduling needs, including cancellations and rescheduling, please call [place], [phone]. ? ? ?Important Information About Sugar ? ? ? ? ? ? ?

## 2021-10-24 LAB — COMPREHENSIVE METABOLIC PANEL
ALT: 12 IU/L (ref 0–32)
AST: 17 IU/L (ref 0–40)
Albumin/Globulin Ratio: 1.7 (ref 1.2–2.2)
Albumin: 4.3 g/dL (ref 3.7–4.7)
Alkaline Phosphatase: 70 IU/L (ref 44–121)
BUN/Creatinine Ratio: 9 — ABNORMAL LOW (ref 12–28)
BUN: 10 mg/dL (ref 8–27)
Bilirubin Total: 0.4 mg/dL (ref 0.0–1.2)
CO2: 23 mmol/L (ref 20–29)
Calcium: 8.9 mg/dL (ref 8.7–10.3)
Chloride: 105 mmol/L (ref 96–106)
Creatinine, Ser: 1.12 mg/dL — ABNORMAL HIGH (ref 0.57–1.00)
Globulin, Total: 2.6 g/dL (ref 1.5–4.5)
Glucose: 84 mg/dL (ref 70–99)
Potassium: 4.7 mmol/L (ref 3.5–5.2)
Sodium: 140 mmol/L (ref 134–144)
Total Protein: 6.9 g/dL (ref 6.0–8.5)
eGFR: 53 mL/min/{1.73_m2} — ABNORMAL LOW (ref >59)

## 2021-10-24 LAB — LIPID PANEL
Chol/HDL Ratio: 3.3 ratio (ref 0.0–4.4)
Cholesterol, Total: 247 mg/dL — ABNORMAL HIGH (ref 100–199)
HDL: 76 mg/dL (ref >39)
LDL Chol Calc (NIH): 152 mg/dL — ABNORMAL HIGH (ref 0–99)
Triglycerides: 109 mg/dL (ref 0–149)
VLDL Cholesterol Cal: 19 mg/dL (ref 5–40)

## 2021-10-30 ENCOUNTER — Ambulatory Visit (HOSPITAL_COMMUNITY): Payer: Medicare Other | Attending: Internal Medicine

## 2021-10-30 DIAGNOSIS — R079 Chest pain, unspecified: Secondary | ICD-10-CM | POA: Diagnosis not present

## 2021-10-30 LAB — ECHOCARDIOGRAM COMPLETE
Area-P 1/2: 4.36 cm2
S' Lateral: 1.9 cm

## 2021-11-01 ENCOUNTER — Other Ambulatory Visit: Payer: Self-pay | Admitting: *Deleted

## 2021-11-01 MED ORDER — ROSUVASTATIN CALCIUM 10 MG PO TABS: 10.0000 mg | ORAL_TABLET | Freq: Every day | ORAL | 3 refills | Status: DC

## 2021-11-06 ENCOUNTER — Encounter: Payer: Self-pay | Admitting: Pharmacist Clinician (PhC)/ Clinical Pharmacy Specialist

## 2021-11-06 ENCOUNTER — Ambulatory Visit: Payer: Medicare Other | Admitting: Pharmacist Clinician (PhC)/ Clinical Pharmacy Specialist

## 2021-11-06 DIAGNOSIS — I10 Essential (primary) hypertension: Secondary | ICD-10-CM | POA: Diagnosis not present

## 2021-11-06 MED ORDER — AMLODIPINE BESYLATE 5 MG PO TABS: 5.0000 mg | ORAL_TABLET | Freq: Every day | ORAL | 3 refills | Status: DC
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
## 2021-11-06 NOTE — Patient Instructions (Signed)
Return for a a follow up appointment June 5 at 3:30 pm ? ?Check your blood pressure at home twice daily and keep record of the readings.   ? ?Take your BP meds as follows: ? Start amlodipine 5 mg qd  ? ?Bring all of your meds, your BP cuff and your record of home blood pressures to your next appointment.  Exercise as you?re able, try to walk approximately 30 minutes per day.  Keep salt intake to a minimum, especially watch canned and prepared boxed foods.  Eat more fresh fruits and vegetables and fewer canned items.  Avoid eating in fast food restaurants.  ? ? HOW TO TAKE YOUR BLOOD PRESSURE: ?Rest 5 minutes before taking your blood pressure. ? Don?t smoke or drink caffeinated beverages for at least 30 minutes before. ?Take your blood pressure before (not after) you eat. ?Sit comfortably with your back supported and both feet on the floor (don?t cross your legs). ?Elevate your arm to heart level on a table or a desk. ?Use the proper sized cuff. It should fit smoothly and snugly around your bare upper arm. There should be enough room to slip a fingertip under the cuff. The bottom edge of the cuff should be 1 inch above the crease of the elbow. ?Ideally, take 3 measurements at one sitting and record the average. ? ? ?

## 2021-11-06 NOTE — Assessment & Plan Note (Signed)
Patient with essential hypertension, has not had any medications in the past for this.   Will have her start with amlodipine 5 mg once daily.  She will continue to check home BP readings twice daily and return in 1 month for follow up.   ?

## 2021-11-09 ENCOUNTER — Telehealth (HOSPITAL_COMMUNITY): Payer: Self-pay | Admitting: Emergency Medicine

## 2021-11-09 NOTE — Telephone Encounter (Signed)
Attempted to call patient regarding upcoming cardiac CT appointment. °Left message on voicemail with name and callback number °Erma Raiche RN Navigator Cardiac Imaging °[facility] °[phone] Office °[phone] Cell ° °

## 2021-11-12 ENCOUNTER — Ambulatory Visit (HOSPITAL_COMMUNITY)
Admission: RE | Admit: 2021-11-12 | Discharge: 2021-11-12 | Disposition: A | Discharge status: Home / Self Care | Payer: Medicare Other | Source: Ambulatory Visit | Attending: Cardiology | Admitting: Cardiology

## 2021-11-12 DIAGNOSIS — I2510 Atherosclerotic heart disease of native coronary artery without angina pectoris: Secondary | ICD-10-CM | POA: Insufficient documentation

## 2021-11-12 DIAGNOSIS — R079 Chest pain, unspecified: Secondary | ICD-10-CM | POA: Diagnosis not present

## 2021-11-12 MED ORDER — NITROGLYCERIN 0.4 MG SL SUBL
SUBLINGUAL_TABLET | SUBLINGUAL | Status: AC
  Filled 2021-11-12: qty 2

## 2021-11-12 MED ORDER — IOHEXOL 350 MG/ML SOLN
100.0000 mL | Freq: Once | INTRAVENOUS | Status: AC | PRN
  Administered 2021-11-12: 100 mL via INTRAVENOUS

## 2021-11-12 MED ORDER — NITROGLYCERIN 0.4 MG SL SUBL
0.8000 mg | SUBLINGUAL_TABLET | Freq: Once | SUBLINGUAL | Status: AC
  Administered 2021-11-12: 0.8 mg via SUBLINGUAL

## 2021-11-12 MED ORDER — METOPROLOL TARTRATE 5 MG/5ML IV SOLN
5.0000 mg | INTRAVENOUS | Status: DC | PRN
  Administered 2021-11-12 (×2): 5 mg via INTRAVENOUS

## 2021-11-12 MED ORDER — METOPROLOL TARTRATE 5 MG/5ML IV SOLN
INTRAVENOUS | Status: AC
  Filled 2021-11-12: qty 10

## 2021-12-03 ENCOUNTER — Ambulatory Visit: Payer: Medicare Other | Admitting: Pharmacist Clinician (PhC)/ Clinical Pharmacy Specialist

## 2021-12-03 ENCOUNTER — Encounter: Payer: Self-pay | Admitting: Pharmacist Clinician (PhC)/ Clinical Pharmacy Specialist

## 2021-12-03 DIAGNOSIS — I10 Essential (primary) hypertension: Secondary | ICD-10-CM

## 2021-12-03 NOTE — Patient Instructions (Signed)
Return for a a follow up appointment Thursday August 24 at 2 pm  Check your blood pressure at home 3-4 days each week and keep record of the readings.  Take your BP meds as follows:  Increase amlodipine to 10 mg once daily  Bring all of your meds, your BP cuff and your record of home blood pressures to your next appointment.  Exercise as you're able, try to walk approximately 30 minutes per day.  Keep salt intake to a minimum, especially watch canned and prepared boxed foods.  Eat more fresh fruits and vegetables and fewer canned items.  Avoid eating in fast food restaurants.    HOW TO TAKE YOUR BLOOD PRESSURE: Rest 5 minutes before taking your blood pressure.  Don't smoke or drink caffeinated beverages for at least 30 minutes before. Take your blood pressure before (not after) you eat. Sit comfortably with your back supported and both feet on the floor (don't cross your legs). Elevate your arm to heart level on a table or a desk. Use the proper sized cuff. It should fit smoothly and snugly around your bare upper arm. There should be enough room to slip a fingertip under the cuff. The bottom edge of the cuff should be 1 inch above the crease of the elbow. Ideally, take 3 measurements at one sitting and record the average.

## 2021-12-03 NOTE — Progress Notes (Unsigned)
12/03/2021 BRESLIN BURKLOW Sep 05, 1950 [id number]   HPI:  Joyce Martin is a 71 y.o. female patient of Dr Gardiner Rhyme, who presents today for hypertension clinic evaluation.  She was seen by Dr. Gardiner Rhyme two weeks ago as a referral secondary to chest pain.   In that visit her blood pressure was noted to be 160/104.  Patient reported no history of hypertension, but that it is usually elevated at medical appointments.  At her last appointment her home readings were averaging 1406/84, so we started her on amlodipine 5 mg.    Today she returns for follow up.  Reports feeling well, no problems with the amlodipine.  Did have one episode of dizziness, thinks probably secondary to dehydration.  Lasted less than an hour and no problems since.    Blood Pressure Goal:  130/80  Current Medications: none  Family Hx: mother had hypertension, DM2, lived to 41, father died at 75 from MI; sister deceased- ovarian cancer, twin brother deceased cancer, 72 brothers total, only 1 deceased; 2 children, son with cancer (lymph nodes), now clear; daughter in good health  Social Hx: no tobacco, no alcohol, some sweet tea or green/black; no coffee, no soda  Diet: mix of home and eating out; does add salt on few foods at table - very light; no fish (allergy); vegetables fresh/frozen and canned;  rarely snacks, has been eating more fruits and vegetables   Exercise: walks 4-5 miles per day  Home BP readings:   22 readings, average 135/75 (range 116-150/70-83) Previous visit - 20 readings, average 146/84 (range 128-156/69-93)  Intolerances: aspirin causes nausea  Labs: 4/23:  Na 140, K 4.7, Glu 84, BUN 10, SCr 1.12, GFR 53   Wt Readings from Last 3 Encounters:  12/03/21 175 lb 9.6 oz (79.7 kg)  11/06/21 176 lb 9.6 oz (80.1 kg)  10/23/21 178 lb 6.4 oz (80.9 kg)   BP Readings from Last 3 Encounters:  12/03/21 (!) 160/96  11/12/21 129/72  11/06/21 (!) 178/78   Pulse Readings from Last 3 Encounters:   12/03/21 90  11/12/21 75  11/06/21 82    Current Outpatient Medications  Medication Sig Dispense Refill   acetaminophen (TYLENOL) 650 MG CR tablet Take 650 mg by mouth every 8 (eight) hours as needed for pain.     amLODipine (NORVASC) 5 MG tablet Take 1 tablet (5 mg total) by mouth daily. 30 tablet 3   levothyroxine (SYNTHROID) 75 MCG tablet Take 75 mcg by mouth every morning.     metoprolol tartrate (LOPRESSOR) 100 MG tablet Take 1 tablet ('100mg'$ ) TWO hours prior to CT scan 1 tablet 0   Propylene Glycol (SYSTANE COMPLETE) 0.6 % SOLN Place 1 drop into both eyes daily as needed (dry eyes).     rosuvastatin (CRESTOR) 10 MG tablet Take 1 tablet (10 mg total) by mouth daily. 90 tablet 3   No current facility-administered medications for this visit.    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 rate 14, height '5\' 8"'$  (1.727 m), weight 175 lb 9.6 oz (79.7 kg), SpO2  100 %.  Hypertension Patient with essential hypertension as well as some measure of white coat hypertension.  Home readings showed 11/8 point drop in average readings since addition of amlodipine 5 mg.  Will have her increase to 10 mg daily and continue to monitor home readings.  Will see her back in 2-3 months and asked that she bring home cuff in for validation  Tommy Medal PharmD CPP [facility] [address] [place], [place] [address] [phone]

## 2021-12-03 NOTE — Assessment & Plan Note (Signed)
Patient with essential hypertension as well as some measure of white coat hypertension.  Home readings showed 11/8 point drop in average readings since addition of amlodipine 5 mg.  Will have her increase to 10 mg daily and continue to monitor home readings.  Will see her back in 2-3 months and asked that she bring home cuff in for validation

## 2021-12-17 ENCOUNTER — Ambulatory Visit: Payer: Medicare Other | Admitting: "Endocrinology

## 2021-12-18 LAB — TSH: TSH: 8.02 u[IU]/mL — ABNORMAL HIGH (ref 0.450–4.500)

## 2021-12-18 LAB — THYROGLOBULIN LEVEL: Thyroglobulin (TG-RIA): <2 ng/mL

## 2021-12-18 LAB — T4, FREE: Free T4: 1.36 ng/dL (ref 0.82–1.77)

## 2021-12-19 ENCOUNTER — Ambulatory Visit: Payer: Medicare Other | Admitting: "Endocrinology

## 2021-12-19 ENCOUNTER — Encounter: Payer: Self-pay | Admitting: "Endocrinology

## 2021-12-19 VITALS — BP 134/82 | HR 80 | Ht 68.0 in | Wt 172.0 lb

## 2021-12-19 DIAGNOSIS — E782 Mixed hyperlipidemia: Secondary | ICD-10-CM | POA: Diagnosis not present

## 2021-12-19 DIAGNOSIS — Z85850 Personal history of malignant neoplasm of thyroid: Secondary | ICD-10-CM | POA: Diagnosis not present

## 2021-12-19 DIAGNOSIS — E890 Postprocedural hypothyroidism: Secondary | ICD-10-CM

## 2021-12-19 MED ORDER — LEVOTHYROXINE SODIUM 88 MCG PO TABS: 88.0000 ug | ORAL_TABLET | Freq: Every morning | ORAL | 1 refills | Status: DC

## 2021-12-19 NOTE — Patient Instructions (Signed)

## 2021-12-19 NOTE — Progress Notes (Signed)
12/19/2021             Endocrinology follow-up note   Subjective:    Patient ID: Joyce Martin, female    DOB: 07-Jun-1951, PCP Lemmie Evens, MD   Past Medical History:  Diagnosis Date   Elevated BP 07/26/2014   GERD (gastroesophageal reflux disease)    Migraines    Positive test for human papillomavirus (HPV) 2014   on pap   Thyroid cancer ([place]) 11/2016   Past Surgical History:  Procedure Laterality Date   COLONOSCOPY N/A 04/18/2016   Procedure: COLONOSCOPY;  Surgeon: Danie Binder, MD;  Location[facility];  Service: Endoscopy;  Laterality: N/A;  9:30 AM   COLONOSCOPY WITH PROPOFOL N/A 06/29/2021   Procedure: COLONOSCOPY WITH PROPOFOL;  Surgeon: Eloise Harman, DO;  Location: [facility];  Service: Endoscopy;  Laterality: N/A;  2:30 / ASA II   POLYPECTOMY  06/29/2021   Procedure: POLYPECTOMY;  Surgeon: Eloise Harman, DO;  Location: [facility];  Service: Endoscopy;;   REFRACTIVE SURGERY Bilateral 03/15/2021   THYROIDECTOMY N/A 02/10/2017   Procedure: THYROIDECTOMY;  Surgeon: Aviva Signs, MD;  Location: [facility];  Service: General;  Laterality: N/A;   TUBAL LIGATION     Social History   Socioeconomic History   Marital status: Divorced    Spouse name: Not on file   Number of children: Not on file   Years of education: Not on file   Highest education level: Not on file  Occupational History   Not on file  Tobacco Use   Smoking status: Former    Packs/day: 0.25    Years: 5.00    Total pack years: 1.25    Types: Cigarettes    Quit date: 01/30/1979    Years since quitting: 42.9   Smokeless tobacco: Never  Vaping Use   Vaping Use: Never used  Substance and Sexual Activity   Alcohol use: No   Drug use: No   Sexual activity: Yes    Birth control/protection: Post-menopausal  Other Topics Concern   Not on file  Social History Narrative   Not on file   Social Determinants of Health   Financial Resource Strain: Not on file  Food Insecurity: Not  on file  Transportation Needs: Not on file  Physical Activity: Not on file  Stress: Not on file  Social Connections: Not on file   Outpatient Encounter Medications as of 12/19/2021  Medication Sig   acetaminophen (TYLENOL) 650 MG CR tablet Take 650 mg by mouth every 8 (eight) hours as needed for pain.   amLODipine (NORVASC) 5 MG tablet Take 1 tablet (5 mg total) by mouth daily.   levothyroxine (SYNTHROID) 88 MCG tablet Take 1 tablet (88 mcg total) by mouth every morning.   metoprolol tartrate (LOPRESSOR) 100 MG tablet Take 1 tablet (174m) TWO hours prior to CT scan   Propylene Glycol (SYSTANE COMPLETE) 0.6 % SOLN Place 1 drop into both eyes daily as needed (dry eyes).   rosuvastatin (CRESTOR) 10 MG tablet Take 1 tablet (10 mg total) by mouth daily.   [DISCONTINUED] levothyroxine (SYNTHROID) 75 MCG tablet Take 75 mcg by mouth every morning.   No facility-administered encounter medications on file as of 12/19/2021.   ALLERGIES: Allergies  Allergen Reactions   Aspirin Nausea Only   Other Other (See Comments)    COMBID: for stomach -twisting pt's face     VACCINATION STATUS: Immunization History  Administered Date(s) Administered   Influenza, High Dose Seasonal  PF 03/10/2019   Tdap 02/28/2011    HPI  71 year old female patient with medical history as above.  She is being seen in follow-up for  postsurgical hypothyroidism, history of papillary thyroid cancer.   -Her history starts in 2018 when she was found to have abnormal fine-needle aspiration of thyroid nodule. She underwent total thyroidectomy on 02/10/2017 which revealed papillary thyroid cancer follicular variant. -Subsequently, she underwent  I-131 thyroid ablation with Thyrogen stimulation followed by whole body scan which revealed some thyroid remnant in the neck with no evidence of distant metastasis on 05/05/2017. January 2020 thyroid/neck ultrasound was unremarkable. -Her  Thyrogen stimulated whole-body scan on May 08, 2020 was consistent with no evidence of iodine avid distant metastasis. - She is currently on levothyroxine 75 g by mouth every morning.  She reports compliance to this medication.  She has no new complaints today. Her pre-visit TFTs are c/w under replacement.   - She denies dysphagia, shortness of breath, voice change. - She has steady body weight over the last several months.  - She denies palpitations, heat/cold intolerance, and tremors.  Review of Systems  Limited as above. Objective:    BP 134/82   Pulse 80   Ht '5\' 8"'  (1.727 m)   Wt 172 lb (78 kg)   BMI 26.15 kg/m   Wt Readings from Last 3 Encounters:  12/19/21 172 lb (78 kg)  12/03/21 175 lb 9.6 oz (79.7 kg)  11/06/21 176 lb 9.6 oz (80.1 kg)    Physical Exam   1)  - Dedicated thyroid ultrasound showed 2.3 cm nodule on the left lobe and 2 nodules on the isthmus (0.8 cm and 0.7 cm.)  2)  Fine needle aspiration  Diagnosis on 01/09/2017 showed: THYROID, FINE NEEDLE ASPIRATION, LEFT (SPECIMEN 1 OF 1 COLLECTED 71/12/18) ATYPIA OF UNDETERMINED SIGNIFICANCE OR FOLLICULAR LESION OF UNDETERMINED SIGNIFICANCE ([place]ESDA CATEGORY III).  3) Thyroid, thyroidectomy- 02/10/2017. 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 - TUMOR CONFINED WITHIN RENAL CAPSULE. - SEPARATE ADENOMATOUS NODULES. - BENIGN PARATHYROID GLAND. 4) post therapy for body scan from 05/05/2017 showed  Thyroid remnant.  No scintigraphic evidence of iodine-avid metastatic thyroid cancer.   Thyrogen stimulated whole-body scan on May 08, 2020. FINDINGS: No residual radioiodine uptake in the cervical region.   Excreted tracer within colon, urinary bladder, minimally in stomach.   No abnormal sites of radio iodine accumulation to suggest iodine-avid metastatic thyroid cancer.   IMPRESSION: No scintigraphic evidence of iodine-avid metastatic thyroid cancer.    Recent Results (from the past 2160 hour(s))  Comprehensive metabolic panel      Status: Abnormal   Collection Time: 10/23/21  9:46 AM  Result Value Ref Range   Glucose 84 70 - 99 mg/dL   BUN 10 8 - 27 mg/dL   Creatinine, Ser 1.12 (H) 0.57 - 1.00 mg/dL   eGFR 53 (L) >59 mL/min/1.73   BUN/Creatinine Ratio 9 (L) 12 - 28   Sodium 140 134 - 144 mmol/L   Potassium 4.7 3.5 - 5.2 mmol/L   Chloride 105 96 - 106 mmol/L   CO2 23 20 - 29 mmol/L   Calcium 8.9 8.7 - 10.3 mg/dL   Total Protein 6.9 6.0 - 8.5 g/dL   Albumin 4.3 3.7 - 4.7 g/dL   Globulin, Total 2.6 1.5 - 4.5 g/dL   Albumin/Globulin Ratio 1.7 1.2 - 2.2   Bilirubin Total 0.4 0.0 - 1.2 mg/dL   Alkaline Phosphatase 70 44 - 121 IU/L   AST 17 0 -  40 IU/L   ALT 12 0 - 32 IU/L  CBC     Status: None (Preliminary result)   Collection Time: 10/23/21  9:46 AM  Result Value Ref Range   WBC WILL FOLLOW    RBC WILL FOLLOW    Hemoglobin WILL FOLLOW    Hematocrit WILL FOLLOW    MCV WILL FOLLOW    MCH WILL FOLLOW    MCHC WILL FOLLOW    RDW WILL FOLLOW    Platelets WILL FOLLOW    Hematology Comments: WILL FOLLOW    NRBC WILL FOLLOW   Lipid panel     Status: Abnormal   Collection Time: 10/23/21  9:46 AM  Result Value Ref Range   Cholesterol, Total 247 (H) 100 - 199 mg/dL   Triglycerides 109 0 - 149 mg/dL   HDL 76 >39 mg/dL   VLDL Cholesterol Cal 19 5 - 40 mg/dL   LDL Chol Calc (NIH) 152 (H) 0 - 99 mg/dL   Chol/HDL Ratio 3.3 0.0 - 4.4 ratio    Comment:                                   T. Chol/HDL Ratio                                             Men  Women                               1/2 Avg.Risk  3.4    3.3                                   Avg.Risk  5.0    4.4                                2X Avg.Risk  9.6    7.1                                3X Avg.Risk 23.4   11.0   ECHOCARDIOGRAM COMPLETE     Status: None   Collection Time: 10/30/21  4:58 PM  Result Value Ref Range   Area-P 1/2 4.36 cm2   S' Lateral 1.90 cm  TSH     Status: Abnormal   Collection Time: 12/12/21  1:37 PM  Result Value Ref Range    TSH 8.020 (H) 0.450 - 4.500 uIU/mL  T4, free     Status: None   Collection Time: 12/12/21  1:37 PM  Result Value Ref Range   Free T4 1.36 0.82 - 1.77 ng/dL  Thyroglobulin Level     Status: None   Collection Time: 12/12/21  1:37 PM  Result Value Ref Range   Thyroglobulin (TG-RIA) <2.0 ng/mL    Comment: This test was developed and its performance characteristics determined by [facility]. It has not been cleared or approved by the Food and Drug Administration. Reference Range: Pubertal Children and Adults: <40 According to the [facility] of Clinical Biochemistry, the reference interval for Thyroglobulin (TG) should be related to euthyroid patients  and not for patients who underwent thyroidectomy.  TG reference intervals for these patients depend on the residual mass of the thyroid tissue left after surgery.  Establishing a post-operative baseline is recommended.  The assay quantitation limit is 2.0 ng/mL.     Assessment & Plan:   1. Follicular variant of Papillary Thyroid Cancer - Her 02/10/2017 sub total thyroidectomy revealed 0.4 cm unifocal , circumscribed, follicular variant of papillary thyroid cancer.TNM code: pT1a, pNx  -  Due to the fact that it is follicular variant and her relative youth, she was given adjuvant   thyroid remnant ablation utilizing I-131 with post therapy whole-body scan. This showed some thyroid remnant in the neck with no evidence of distant iodine avid metastasis.  -  Her stimulated thyroglobulin levels was detectable at 37. This warrants continuous surveillance for tumor recurrence. -Her most recent unstimulated thyroglobulin level was undetectable at less than 0.1. -She underwent surveillance thyroid/neck ultrasound in January 2020 which was negative for recurrent or residual tissue and no evidence of abnormal adenopathy.  -Her thyroglobulin level is less than 0.1, with undetectable antithyroglobulin antibodies. - Thyrogen stimulated whole-body scan  -on November 2021 was negative for iodine avid distant metastasis.   -Will not need further intervention at this time.  She will be considered for neck ultrasound after her next visit.   2  Post thyroidectomy Hypothyroidism -Her recent thyroid function tests are consistent with slight under replacement.  I discussed and increased her levothyroxine to 88 mcg p.o. daily before breakfast    - We discussed about the correct intake of her thyroid hormone, on empty stomach at fasting, with water, separated by at least 30 minutes from breakfast and other medications,  and separated by more than 4 hours from calcium, iron, multivitamins, acid reflux medications (PPIs). -Patient is made aware of the fact that thyroid hormone replacement is needed for life, dose to be adjusted by periodic monitoring of thyroid function tests.  3.  Hyperlipidemia: Recent diagnosis Patient was initiated on rosuvastatin 10 mg p.o. nightly.  She is advised to continue.  She is also counseled on whole food plant-based diet.     - I advised patient to maintain close follow up with Lemmie Evens, MD for primary care needs.   I spent 31 minutes in the care of the patient today including review of labs from Thyroid Function, CMP, and other relevant labs ; imaging/biopsy records (current and previous including abstractions from other facilities); face-to-face time discussing  her lab results and symptoms, medications doses, her options of short and long term treatment based on the latest standards of care / guidelines;   and documenting the encounter.  Joyce Martin  participated in the discussions, expressed understanding, and voiced agreement with the above plans.  All questions were answered to her satisfaction. she is encouraged to contact clinic should she have any questions or concerns prior to her return visit.    Follow up plan: Return in about 6 months (around 06/20/2022) for Fasting Labs  in AM B4 8.  Glade Lloyd, MD Phone: [phone]  Fax: [phone]  -  This note was partially dictated with voice recognition software. Similar sounding words can be transcribed inadequately or may not  be corrected upon review.  12/19/2021, 6:11 PM

## 2022-02-21 ENCOUNTER — Encounter: Payer: Self-pay | Admitting: Pharmacist Clinician (PhC)/ Clinical Pharmacy Specialist

## 2022-02-21 ENCOUNTER — Ambulatory Visit: Payer: Medicare Other | Admitting: Pharmacist Clinician (PhC)/ Clinical Pharmacy Specialist

## 2022-02-21 DIAGNOSIS — I10 Essential (primary) hypertension: Secondary | ICD-10-CM | POA: Diagnosis not present

## 2022-02-21 MED ORDER — ROSUVASTATIN CALCIUM 10 MG PO TABS
10.0000 mg | ORAL_TABLET | Freq: Every day | ORAL | 3 refills | Status: DC
Start: 2022-02-21 — End: 2023-02-04

## 2022-02-21 MED ORDER — AMLODIPINE BESYLATE 10 MG PO TABS: 10.0000 mg | ORAL_TABLET | Freq: Every day | ORAL | 3 refills | Status: DC

## 2022-02-21 NOTE — Progress Notes (Signed)
02/21/2022 VELVIE THOMASTON Mar 20, 1951 [id number]   HPI:  Joyce Martin is a 71 y.o. female patient of Dr Gardiner Rhyme, who presents today for hypertension clinic evaluation.  She was seen by Dr. Gardiner Rhyme two weeks ago as a referral secondary to chest pain.   In that visit her blood pressure was noted to be 160/104.  Patient reported no history of hypertension, but that it is usually elevated at medical appointments.  When home readings were averaging 1406/84 we started her on amlodipine 5 mg, and at last visit that was increased to 10 mg daily.      Today she returns for follow up.  Has not had any issues with the amlodipine 10 mg dose and notes that in the past several weeks her home BP readings are mostly 177-939 systolic with only a few up to 140.  Diastolic readings all WNL.    Blood Pressure Goal:  130/80  Current Medications: amlodipine 10 mg daily  Family Hx: mother had hypertension, DM2, lived to 80, father died at 20 from MI; sister deceased- ovarian cancer, twin brother deceased cancer, 32 brothers total, only 1 deceased; 2 children, son with cancer (lymph nodes), now clear; daughter in good health  Social Hx: no tobacco, no alcohol, some sweet tea or green/black; no coffee, no soda  Diet: mix of home and eating out; does add salt on few foods at table - very light; no fish (allergy); vegetables fresh/frozen and canned;  rarely snacks, has been eating more fruits and vegetables   Exercise: walks 4-5 miles per day  Home BP readings: bought new cuff recently, it read 15 points higher systolic, but within 5 on diastolic and HR.  Despite reading higher, her home readings in the past 2 weeks were almost all between 030 and 092 systolic   Intolerances: aspirin causes nausea  Labs: 4/23:  Na 140, K 4.7, Glu 84, BUN 10, SCr 1.12, GFR 53   Wt Readings from Last 3 Encounters:  02/21/22 172 lb (78 kg)  12/19/21 172 lb (78 kg)  12/03/21 175 lb 9.6 oz (79.7 kg)   BP Readings from  Last 3 Encounters:  02/21/22 116/73  12/19/21 134/82  12/03/21 (!) 160/96   Pulse Readings from Last 3 Encounters:  02/21/22 79  12/19/21 80  12/03/21 90    Current Outpatient Medications  Medication Sig Dispense Refill   acetaminophen (TYLENOL) 650 MG CR tablet Take 650 mg by mouth every 8 (eight) hours as needed for pain.     amLODipine (NORVASC) 10 MG tablet Take 1 tablet (10 mg total) by mouth daily. 90 tablet 3   levothyroxine (SYNTHROID) 88 MCG tablet Take 1 tablet (88 mcg total) by mouth every morning. 90 tablet 1   metoprolol tartrate (LOPRESSOR) 100 MG tablet Take 1 tablet ('100mg'$ ) TWO hours prior to CT scan 1 tablet 0   Propylene Glycol (SYSTANE COMPLETE) 0.6 % SOLN Place 1 drop into both eyes daily as needed (dry eyes).     rosuvastatin (CRESTOR) 10 MG tablet Take 1 tablet (10 mg total) by mouth daily. 90 tablet 3   No current facility-administered medications for this visit.    Allergies  Allergen Reactions   Aspirin Nausea Only   Other Other (See Comments)    COMBID: for stomach -twisting pt's face     Past Medical History:  Diagnosis Date   Elevated BP 07/26/2014   GERD (gastroesophageal reflux disease)    Migraines    Positive test  for human papillomavirus (HPV) 2014   on pap   Thyroid cancer ([place]) 11/2016    Blood pressure 116/73, pulse 79, height '5\' 8"'$  (1.727 m), weight 172 lb (78 kg).       Hypertension Patient with essential hypertension now on amlodipine 10 mg daily.   BP at goal in office today as are most of home readings from past 2 weeks (in memory of home meter).  She will continue with amlodipine 10 mg daily and monitor pressure 3-4 times each week.  She was encouraged to continue with her daily exercise regimen, just be cautious in the hotter weather.  She will follow up with Dr. Gardiner Rhyme in October and we can see her anytime after that as needed.     Tommy Medal PharmD CPP [facility] [address] [place], [place] [address] [phone]

## 2022-02-21 NOTE — Assessment & Plan Note (Signed)
Patient with essential hypertension now on amlodipine 10 mg daily.   BP at goal in office today as are most of home readings from past 2 weeks (in memory of home meter).  She will continue with amlodipine 10 mg daily and monitor pressure 3-4 times each week.  She was encouraged to continue with her daily exercise regimen, just be cautious in the hotter weather.  She will follow up with Dr. Gardiner Rhyme in October and we can see her anytime after that as needed.

## 2022-02-21 NOTE — Patient Instructions (Signed)
Return for a a follow up appointment with Dr. Gardiner Rhyme in October  Check your blood pressure at home at least 3-4 times each week and keep record of the readings.  Take your BP meds as follows:  No changes to medications today.   Bring all of your meds, your BP cuff and your record of home blood pressures to your next appointment.  Exercise as you're able, try to walk approximately 30 minutes per day.  Keep salt intake to a minimum, especially watch canned and prepared boxed foods.  Eat more fresh fruits and vegetables and fewer canned items.  Avoid eating in fast food restaurants.    HOW TO TAKE YOUR BLOOD PRESSURE: Rest 5 minutes before taking your blood pressure.  Don't smoke or drink caffeinated beverages for at least 30 minutes before. Take your blood pressure before (not after) you eat. Sit comfortably with your back supported and both feet on the floor (don't cross your legs). Elevate your arm to heart level on a table or a desk. Use the proper sized cuff. It should fit smoothly and snugly around your bare upper arm. There should be enough room to slip a fingertip under the cuff. The bottom edge of the cuff should be 1 inch above the crease of the elbow. Ideally, take 3 measurements at one sitting and record the average.

## 2022-03-13 ENCOUNTER — Encounter (INDEPENDENT_AMBULATORY_CARE_PROVIDER_SITE_OTHER): Payer: Medicare Other | Admitting: Ophthalmology

## 2022-03-13 DIAGNOSIS — H33303 Unspecified retinal break, bilateral: Secondary | ICD-10-CM | POA: Diagnosis not present

## 2022-03-13 DIAGNOSIS — H43813 Vitreous degeneration, bilateral: Secondary | ICD-10-CM | POA: Diagnosis not present

## 2022-03-27 ENCOUNTER — Encounter (INDEPENDENT_AMBULATORY_CARE_PROVIDER_SITE_OTHER): Payer: Medicare Other | Admitting: Ophthalmology

## 2022-03-27 DIAGNOSIS — H33302 Unspecified retinal break, left eye: Secondary | ICD-10-CM

## 2022-04-24 ENCOUNTER — Ambulatory Visit: Payer: Medicare Other | Attending: Cardiology | Admitting: Cardiology

## 2022-04-24 ENCOUNTER — Encounter: Payer: Self-pay | Admitting: Cardiology

## 2022-04-24 VITALS — BP 118/74 | HR 82 | Ht 68.0 in | Wt 167.8 lb

## 2022-04-24 DIAGNOSIS — E785 Hyperlipidemia, unspecified: Secondary | ICD-10-CM | POA: Diagnosis not present

## 2022-04-24 DIAGNOSIS — I10 Essential (primary) hypertension: Secondary | ICD-10-CM | POA: Diagnosis not present

## 2022-04-24 DIAGNOSIS — I2510 Atherosclerotic heart disease of native coronary artery without angina pectoris: Secondary | ICD-10-CM

## 2022-04-24 DIAGNOSIS — R600 Localized edema: Secondary | ICD-10-CM

## 2022-04-24 MED ORDER — LOSARTAN POTASSIUM 25 MG PO TABS: 25.0000 mg | ORAL_TABLET | Freq: Every day | ORAL | 3 refills | Status: DC

## 2022-04-24 MED ORDER — AMLODIPINE BESYLATE 5 MG PO TABS: 5.0000 mg | ORAL_TABLET | Freq: Every day | ORAL | 3 refills | Status: DC

## 2022-04-24 NOTE — Patient Instructions (Signed)
Medication Instructions:  DECREASE amlodipine to 5 mg daily START Losartan 25 mg daily  *If you need a refill on your cardiac medications before your next appointment, please call your pharmacy*   Lab Work: CMET, Lipid, BNP today  If you have labs (blood work) drawn today and your tests are completely normal, you will receive your results only by: [place] (if you have MyChart) OR A paper copy in the mail If you have any lab test that is abnormal or we need to change your treatment, we will call you to review the results.  Follow-Up: At [facility], you and your health needs are our priority.  As part of our continuing mission to provide you with exceptional heart care, we have created designated Provider Care Teams.  These Care Teams include your primary Cardiologist (physician) and Advanced Practice Providers (APPs -  Physician Assistants and Nurse Practitioners) who all work together to provide you with the care you need, when you need it.  We recommend signing up for the patient portal called "MyChart".  Sign up information is provided on this After Visit Summary.  MyChart is used to connect with patients for Virtual Visits (Telemedicine).  Patients are able to view lab/test results, encounter notes, upcoming appointments, etc.  Non-urgent messages can be sent to your provider as well.   To learn more about what you can do with MyChart, go to [email or web address].    Your next appointment:   6 month(s)  The format for your next appointment:   In Person  Provider:   Dr. Gardiner Rhyme Other Instructions Please check your blood pressure at home daily, write it down.  Call the office or send message via Mychart with the readings in 2 weeks for Dr. Gardiner Rhyme to review.

## 2022-04-24 NOTE — Progress Notes (Signed)
Cardiology Office Note:    Date:  04/24/2022   ID:  Joyce Martin, DOB 1951/02/26, MRN [id number]  PCP:  Lemmie Evens, MD  Cardiologist:  None  Electrophysiologist:  None   Referring MD: Lemmie Evens, MD   Chief Complaint  Patient presents with   Edema    History of Present Illness:    Joyce Martin is a 71 y.o. female with a hx of thyroid cancer, migraines, GERD who presents for follow-up.  She was referred by Dr. Karie Kirks for evaluation of chest pain, initially seen on 10/23/2021.  She reports she started having chest pain 1 month prior.  Occurs daily, particularly notices it when she is lifting something.  Describes dull ache in the left side of her chest.  Can last for hours.  Has awoken her from sleep.  She walks 5 miles per day.  When she walks faster she will feel the chest pain.  Also has some shortness of breath when walking up hills.  Denies any lightheadedness, syncope, lower extreme edema, or palpitations.  She smoked cigarettes as a teenager but none since.  Family history includes father died of MI in 53s.  She checks her BP at home and reports has been normal (110s to 120s).  Echocardiogram 10/30/2021 showed normal biventricular function, grade 1 diastolic dysfunction, no significant valvular disease.  Coronary CTA showed nonobstructive CAD with calcified plaque causing minimal stenosis in proximal LAD and ostial RCA, calcium score 76 (71st percentile).  Since last clinic visit, she reports that she has been doing well.  States that chest pain resolved.  Denies any dyspnea, lightheadedness, syncope, palpitations.  Reports has been having swelling in feet/ankles recently, which has been causing her discomfort.   Past Medical History:  Diagnosis Date   Elevated BP 07/26/2014   GERD (gastroesophageal reflux disease)    Migraines    Positive test for human papillomavirus (HPV) 2014   on pap   Thyroid cancer ([place]) 11/2016    Past Surgical History:  Procedure  Laterality Date   COLONOSCOPY N/A 04/18/2016   Procedure: COLONOSCOPY;  Surgeon: Danie Binder, MD;  [facility];  Service: Endoscopy;  Laterality: N/A;  9:30 AM   COLONOSCOPY WITH PROPOFOL N/A 06/29/2021   Procedure: COLONOSCOPY WITH PROPOFOL;  Surgeon: Eloise Harman, DO;  [facility];  Service: Endoscopy;  Laterality: N/A;  2:30 / ASA II   POLYPECTOMY  06/29/2021   Procedure: POLYPECTOMY;  Surgeon: Eloise Harman, DO;  [facility];  Service: Endoscopy;;   REFRACTIVE SURGERY Bilateral 03/15/2021   THYROIDECTOMY N/A 02/10/2017   Procedure: THYROIDECTOMY;  Surgeon: Aviva Signs, MD;  Location: [facility];  Service: General;  Laterality: N/A;   TUBAL LIGATION      Current Medications: Current Meds  Medication Sig   levothyroxine (SYNTHROID) 88 MCG tablet Take 1 tablet (88 mcg total) by mouth every morning.   losartan (COZAAR) 25 MG tablet Take 1 tablet (25 mg total) by mouth daily.   rosuvastatin (CRESTOR) 10 MG tablet Take 1 tablet (10 mg total) by mouth daily.   [DISCONTINUED] amLODipine (NORVASC) 10 MG tablet Take 1 tablet (10 mg total) by mouth daily.     Allergies:   Aspirin and Other   Social History   Socioeconomic History   Marital status: Divorced    Spouse name: Not on file   Number of children: Not on file   Years of education: Not on file   Highest education level:  Not on file  Occupational History   Not on file  Tobacco Use   Smoking status: Former    Packs/day: 0.25    Years: 5.00    Total pack years: 1.25    Types: Cigarettes    Quit date: 01/30/1979    Years since quitting: 43.2   Smokeless tobacco: Never  Vaping Use   Vaping Use: Never used  Substance and Sexual Activity   Alcohol use: No   Drug use: No   Sexual activity: Yes    Birth control/protection: Post-menopausal  Other Topics Concern   Not on file  Social History Narrative   Not on file   Social Determinants of Health   Financial Resource Strain:  Not on file  Food Insecurity: Not on file  Transportation Needs: Not on file  Physical Activity: Not on file  Stress: Not on file  Social Connections: Not on file     Family History: The patient's family history includes COPD in her brother; Cancer in her brother and sister; Diabetes in her brother, daughter, maternal grandmother, mother, paternal grandfather, and paternal grandmother; Heart disease in her father; Hypertension in her brother and mother; Parkinson's disease in her brother. There is no history of Colon cancer.  ROS:   Please see the history of present illness.     All other systems reviewed and are negative.  EKGs/Labs/Other Studies Reviewed:    The following studies were reviewed today:   EKG:   10/23/2021: Normal sinus rhythm, rate 85, no ST abnormalities 04/24/22: Normal sinus rhythm, rate 82, no ST abnormalities  Recent Labs: 10/23/2021: ALT 12; BUN 10; Creatinine, Ser 1.12; Hemoglobin WILL FOLLOW; Platelets WILL FOLLOW; Potassium 4.7; Sodium 140 12/12/2021: TSH 8.020  Recent Lipid Panel    Component Value Date/Time   CHOL 247 (H) 10/23/2021 0946   TRIG 109 10/23/2021 0946   HDL 76 10/23/2021 0946   CHOLHDL 3.3 10/23/2021 0946   LDLCALC 152 (H) 10/23/2021 0946    Physical Exam:    VS:  BP 118/74 (BP Location: Left Arm, Patient Position: Sitting, Cuff Size: Normal)   Pulse 82   Ht '5\' 8"'$  (1.727 m)   Wt 167 lb 12.8 oz (76.1 kg)   SpO2 95%   BMI 25.51 kg/m     Wt Readings from Last 3 Encounters:  04/24/22 167 lb 12.8 oz (76.1 kg)  02/21/22 172 lb (78 kg)  12/19/21 172 lb (78 kg)     GEN:  Well nourished, well developed in no acute distress HEENT: Normal NECK: No JVD; No carotid bruits LYMPHATICS: No lymphadenopathy CARDIAC: RRR, no murmurs, rubs, gallops RESPIRATORY:  Clear to auscultation without rales, wheezing or rhonchi  ABDOMEN: Soft, non-tender, non-distended MUSCULOSKELETAL:  No edema; No deformity  SKIN: Warm and dry NEUROLOGIC:  Alert  and oriented x 3 PSYCHIATRIC:  Normal affect   ASSESSMENT:    1. Hypertension, unspecified type   2. Bilateral leg edema   3. Coronary artery disease involving native coronary artery of native heart without angina pectoris   4. Hyperlipidemia, unspecified hyperlipidemia type     PLAN:    CAD: Reported atypical chest pain.  Echocardiogram 10/30/2021 showed normal biventricular function, grade 1 diastolic dysfunction, no significant valvular disease.  Coronary CTA showed nonobstructive CAD with calcified plaque causing minimal stenosis in proximal LAD and ostial RCA, calcium score 76 (71st percentile). -Continue rosuvastatin  Hypertension: On amlodipine 10 mg daily.  She is reporting swelling in feet/ankles, suspect secondary to amlodipine use.  Will  check CMET, BNP.  Will decrease amlodipine to 5 mg daily.  Add losartan 25 mg daily.  Asked to check BP daily for next 2 weeks and call with results.  Hyperlipidemia: LDL 152 on 10/23/2021.  Started on rosuvastatin 10 mg daily.  Check lipid panel  RTC in 6 months   Medication Adjustments/Labs and Tests Ordered: Current medicines are reviewed at length with the patient today.  Concerns regarding medicines are outlined above.  Orders Placed This Encounter  Procedures   Comprehensive metabolic panel   Brain natriuretic peptide   Lipid panel   EKG 12-Lead   Meds ordered this encounter  Medications   amLODipine (NORVASC) 5 MG tablet    Sig: Take 1 tablet (5 mg total) by mouth daily.    Dispense:  90 tablet    Refill:  3    Dose decrease   losartan (COZAAR) 25 MG tablet    Sig: Take 1 tablet (25 mg total) by mouth daily.    Dispense:  90 tablet    Refill:  3    Patient Instructions  Medication Instructions:  DECREASE amlodipine to 5 mg daily START Losartan 25 mg daily  *If you need a refill on your cardiac medications before your next appointment, please call your pharmacy*   Lab Work: CMET, Lipid, BNP today  If you have labs  (blood work) drawn today and your tests are completely normal, you will receive your results only by: [place] (if you have MyChart) OR A paper copy in the mail If you have any lab test that is abnormal or we need to change your treatment, we will call you to review the results.  Follow-Up: At [facility], you and your health needs are our priority.  As part of our continuing mission to provide you with exceptional heart care, we have created designated Provider Care Teams.  These Care Teams include your primary Cardiologist (physician) and Advanced Practice Providers (APPs -  Physician Assistants and Nurse Practitioners) who all work together to provide you with the care you need, when you need it.  We recommend signing up for the patient portal called "MyChart".  Sign up information is provided on this After Visit Summary.  MyChart is used to connect with patients for Virtual Visits (Telemedicine).  Patients are able to view lab/test results, encounter notes, upcoming appointments, etc.  Non-urgent messages can be sent to your provider as well.   To learn more about what you can do with MyChart, go to [email or web address].    Your next appointment:   6 month(s)  The format for your next appointment:   In Person  Provider:   Dr. Gardiner Rhyme Other Instructions Please check your blood pressure at home daily, write it down.  Call the office or send message via Mychart with the readings in 2 weeks for Dr. Gardiner Rhyme to review.          Signed, Donato Heinz, MD  04/24/2022 5:30 PM    [place]

## 2022-04-26 LAB — COMPREHENSIVE METABOLIC PANEL
ALT: 17 IU/L (ref 0–32)
AST: 20 IU/L (ref 0–40)
Albumin/Globulin Ratio: 2 (ref 1.2–2.2)
Albumin: 4.6 g/dL (ref 3.8–4.8)
Alkaline Phosphatase: 66 IU/L (ref 44–121)
BUN/Creatinine Ratio: 12 (ref 12–28)
BUN: 12 mg/dL (ref 8–27)
Bilirubin Total: 0.4 mg/dL (ref 0.0–1.2)
CO2: 24 mmol/L (ref 20–29)
Calcium: 8.9 mg/dL (ref 8.7–10.3)
Chloride: 102 mmol/L (ref 96–106)
Creatinine, Ser: 1.04 mg/dL — ABNORMAL HIGH (ref 0.57–1.00)
Globulin, Total: 2.3 g/dL (ref 1.5–4.5)
Glucose: 86 mg/dL (ref 70–99)
Potassium: 4.6 mmol/L (ref 3.5–5.2)
Sodium: 140 mmol/L (ref 134–144)
Total Protein: 6.9 g/dL (ref 6.0–8.5)
eGFR: 57 mL/min/{1.73_m2} — ABNORMAL LOW (ref >59)

## 2022-04-26 LAB — BRAIN NATRIURETIC PEPTIDE: BNP: 10.4 pg/mL (ref 0.0–100.0)

## 2022-04-26 LAB — LIPID PANEL
Chol/HDL Ratio: 2.2 ratio (ref 0.0–4.4)
Cholesterol, Total: 149 mg/dL (ref 100–199)
HDL: 67 mg/dL (ref >39)
LDL Chol Calc (NIH): 67 mg/dL (ref 0–99)
Triglycerides: 78 mg/dL (ref 0–149)
VLDL Cholesterol Cal: 15 mg/dL (ref 5–40)

## 2022-04-29 ENCOUNTER — Other Ambulatory Visit: Payer: Self-pay | Admitting: *Deleted

## 2022-04-29 DIAGNOSIS — I10 Essential (primary) hypertension: Secondary | ICD-10-CM

## 2022-04-29 DIAGNOSIS — Z79899 Other long term (current) drug therapy: Secondary | ICD-10-CM

## 2022-06-07 ENCOUNTER — Other Ambulatory Visit (HOSPITAL_COMMUNITY): Payer: Self-pay | Admitting: Nurse Practitioner

## 2022-06-07 DIAGNOSIS — Z780 Asymptomatic menopausal state: Secondary | ICD-10-CM

## 2022-06-20 ENCOUNTER — Ambulatory Visit: Payer: Medicare Other | Admitting: "Endocrinology

## 2022-06-20 ENCOUNTER — Encounter: Payer: Self-pay | Admitting: "Endocrinology

## 2022-06-20 VITALS — BP 130/66 | HR 80 | Ht 68.0 in | Wt 164.0 lb

## 2022-06-20 DIAGNOSIS — E782 Mixed hyperlipidemia: Secondary | ICD-10-CM | POA: Diagnosis not present

## 2022-06-20 DIAGNOSIS — Z85850 Personal history of malignant neoplasm of thyroid: Secondary | ICD-10-CM | POA: Diagnosis not present

## 2022-06-20 DIAGNOSIS — E890 Postprocedural hypothyroidism: Secondary | ICD-10-CM

## 2022-06-20 NOTE — Progress Notes (Signed)
06/20/2022             Endocrinology follow-up note   Subjective:    Patient ID: Joyce Martin, female    DOB: 02/06/1951, PCP Lemmie Evens, MD   Past Medical History:  Diagnosis Date   Elevated BP 07/26/2014   GERD (gastroesophageal reflux disease)    Migraines    Positive test for human papillomavirus (HPV) 2014   on pap   Thyroid cancer ([place]) 11/2016   Past Surgical History:  Procedure Laterality Date   COLONOSCOPY N/A 04/18/2016   Procedure: COLONOSCOPY;  Surgeon: Danie Binder, MD;  [facility];  Service: Endoscopy;  Laterality: N/A;  9:30 AM   COLONOSCOPY WITH PROPOFOL N/A 06/29/2021   Procedure: COLONOSCOPY WITH PROPOFOL;  Surgeon: Eloise Harman, DO;  [facility];  Service: Endoscopy;  Laterality: N/A;  2:30 / ASA II   POLYPECTOMY  06/29/2021   Procedure: POLYPECTOMY;  Surgeon: Eloise Harman, DO;  [facility];  Service: Endoscopy;;   REFRACTIVE SURGERY Bilateral 03/15/2021   THYROIDECTOMY N/A 02/10/2017   Procedure: THYROIDECTOMY;  Surgeon: Aviva Signs, MD;  Location: [facility];  Service: General;  Laterality: N/A;   TUBAL LIGATION     Social History   Socioeconomic History   Marital status: Divorced    Spouse name: Not on file   Number of children: Not on file   Years of education: Not on file   Highest education level: Not on file  Occupational History   Not on file  Tobacco Use   Smoking status: Former    Packs/day: 0.25    Years: 5.00    Total pack years: 1.25    Types: Cigarettes    Quit date: 01/30/1979    Years since quitting: 43.4   Smokeless tobacco: Never  Vaping Use   Vaping Use: Never used  Substance and Sexual Activity   Alcohol use: No   Drug use: No   Sexual activity: Yes    Birth control/protection: Post-menopausal  Other Topics Concern   Not on file  Social History Narrative   Not on file   Social Determinants of Health   Financial Resource Strain: Not on file  Food Insecurity: Not  on file  Transportation Needs: Not on file  Physical Activity: Not on file  Stress: Not on file  Social Connections: Not on file   Outpatient Encounter Medications as of 06/20/2022  Medication Sig   acetaminophen (TYLENOL) 650 MG CR tablet Take 650 mg by mouth every 8 (eight) hours as needed for pain.   Propylene Glycol (SYSTANE COMPLETE) 0.6 % SOLN Place 1 drop into both eyes daily as needed (dry eyes).   amLODipine (NORVASC) 5 MG tablet Take 1 tablet (5 mg total) by mouth daily.   levothyroxine (SYNTHROID) 88 MCG tablet Take 1 tablet (88 mcg total) by mouth every morning.   losartan (COZAAR) 25 MG tablet Take 1 tablet (25 mg total) by mouth daily.   rosuvastatin (CRESTOR) 10 MG tablet Take 1 tablet (10 mg total) by mouth daily.   [DISCONTINUED] metoprolol tartrate (LOPRESSOR) 100 MG tablet Take 1 tablet (112m) TWO hours prior to CT scan (Patient not taking: Reported on 04/24/2022)   No facility-administered encounter medications on file as of 06/20/2022.   ALLERGIES: Allergies  Allergen Reactions   Aspirin Nausea Only   Other Other (See Comments)    COMBID: for stomach -twisting pt's face     VACCINATION STATUS: Immunization History  Administered  Date(s) Administered   Influenza, High Dose Seasonal PF 03/10/2019   Tdap 02/28/2011    HPI  71 year old female patient with medical history as above.  She is being seen in follow-up for  postsurgical hypothyroidism, history of papillary thyroid cancer.   -Her history starts in 2018 when she was found to have abnormal fine-needle aspiration of thyroid nodule. She underwent total thyroidectomy on 02/10/2017 which revealed papillary thyroid cancer follicular variant. -Subsequently, she underwent  I-131 thyroid ablation with Thyrogen stimulation followed by whole body scan which revealed some thyroid remnant in the neck with no evidence of distant metastasis on 05/05/2017. January 2020 thyroid/neck ultrasound was unremarkable. -Her   Thyrogen stimulated whole-body scan on May 08, 2020 was consistent with no evidence of iodine avid distant metastasis. - She is currently on levothyroxine 88 g by mouth every morning.  She reports compliance to this medication.  She has no new complaints today. Her pre-visit TFTs are consistent with appropriate replacement.    - She denies dysphagia, shortness of breath, voice change. - She has steady body weight over the last several months.  - She denies palpitations, heat/cold intolerance, and tremors.  Review of Systems  Limited as above. Objective:    BP 130/66   Pulse 80   Ht _0  (1.727 m)   Wt 164 lb (74.4 kg)   BMI 24.94 kg/m   Wt Readings from Last 3 Encounters:  06/20/22 164 lb (74.4 kg)  04/24/22 167 lb 12.8 oz (76.1 kg)  02/21/22 172 lb (78 kg)    Physical Exam   1)  - Dedicated thyroid ultrasound showed 2.3 cm nodule on the left lobe and 2 nodules on the isthmus (0.8 cm and 0.7 cm.)  2)  Fine needle aspiration  Diagnosis on 01/09/2017 showed: THYROID, FINE NEEDLE ASPIRATION, LEFT (SPECIMEN 1 OF 1 COLLECTED 71/12/18) ATYPIA OF UNDETERMINED SIGNIFICANCE OR FOLLICULAR LESION OF UNDETERMINED SIGNIFICANCE ([place] CATEGORY III).  3) Thyroid, thyroidectomy- 02/10/2017. 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 - TUMOR CONFINED WITHIN RENAL CAPSULE. - SEPARATE ADENOMATOUS NODULES. - BENIGN PARATHYROID GLAND. 4) post therapy for body scan from 05/05/2017 showed  Thyroid remnant.  No scintigraphic evidence of iodine-avid metastatic thyroid cancer.   Thyrogen stimulated whole-body scan on May 08, 2020. FINDINGS: No residual radioiodine uptake in the cervical region.   Excreted tracer within colon, urinary bladder, minimally in stomach.   No abnormal sites of radio iodine accumulation to suggest iodine-avid metastatic thyroid cancer.   IMPRESSION: No scintigraphic evidence of iodine-avid metastatic thyroid cancer.    Recent  Results (from the past 2160 hour(s))  Comprehensive metabolic panel     Status: Abnormal   Collection Time: 04/24/22  3:58 PM  Result Value Ref Range   Glucose 86 70 - 99 mg/dL   BUN 12 8 - 27 mg/dL   Creatinine, Ser 1.04 (H) 0.57 - 1.00 mg/dL   eGFR 57 (L) >59 mL/min/1.73   BUN/Creatinine Ratio 12 12 - 28   Sodium 140 134 - 144 mmol/L   Potassium 4.6 3.5 - 5.2 mmol/L   Chloride 102 96 - 106 mmol/L   CO2 24 20 - 29 mmol/L   Calcium 8.9 8.7 - 10.3 mg/dL   Total Protein 6.9 6.0 - 8.5 g/dL   Albumin 4.6 3.8 - 4.8 g/dL   Globulin, Total 2.3 1.5 - 4.5 g/dL   Albumin/Globulin Ratio 2.0 1.2 - 2.2   Bilirubin Total 0.4 0.0 - 1.2 mg/dL   Alkaline Phosphatase 66 44 -  121 IU/L   AST 20 0 - 40 IU/L   ALT 17 0 - 32 IU/L  Brain natriuretic peptide     Status: None   Collection Time: 04/24/22  3:58 PM  Result Value Ref Range   BNP 10.4 0.0 - 100.0 pg/mL    Comment: Siemens ADVIA Centaur XP methodology  Lipid panel     Status: None   Collection Time: 04/24/22  3:58 PM  Result Value Ref Range   Cholesterol, Total 149 100 - 199 mg/dL   Triglycerides 78 0 - 149 mg/dL   HDL 67 >39 mg/dL   VLDL Cholesterol Cal 15 5 - 40 mg/dL   LDL Chol Calc (NIH) 67 0 - 99 mg/dL   Chol/HDL Ratio 2.2 0.0 - 4.4 ratio    Comment:                                   T. Chol/HDL Ratio                                             Men  Women                               1/2 Avg.Risk  3.4    3.3                                   Avg.Risk  5.0    4.4                                2X Avg.Risk  9.6    7.1                                3X Avg.Risk 23.4   11.0   TSH     Status: None   Collection Time: 06/14/22  9:29 AM  Result Value Ref Range   TSH 3.360 0.450 - 4.500 uIU/mL  T4, free     Status: None   Collection Time: 06/14/22  9:29 AM  Result Value Ref Range   Free T4 1.37 0.82 - 1.77 ng/dL  Thyroglobulin Level     Status: None (Preliminary result)   Collection Time: 06/14/22  9:29 AM  Result Value Ref Range    Thyroglobulin (TG-RIA) WILL FOLLOW   Lipid panel     Status: None   Collection Time: 06/14/22  9:29 AM  Result Value Ref Range   Cholesterol, Total 167 100 - 199 mg/dL   Triglycerides 65 0 - 149 mg/dL   HDL 66 >39 mg/dL   VLDL Cholesterol Cal 13 5 - 40 mg/dL   LDL Chol Calc (NIH) 88 0 - 99 mg/dL   Chol/HDL Ratio 2.5 0.0 - 4.4 ratio    Comment:                                   T. 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 This showed some thyroid remnant in the neck with no evidence of distant iodine avid metastasis.  -  Her stimulated thyroglobulin levels was detectable at 37. This warrants continuous surveillance for tumor recurrence. -Her most recent unstimulated thyroglobulin level was undetectable at less than 0.1. -She underwent surveillance thyroid/neck ultrasound in January 2020 which was negative for recurrent or residual tissue and no evidence of abnormal adenopathy.  -Her thyroglobulin level is less than 0.1, with undetectable antithyroglobulin antibodies. - Thyrogen stimulated whole-body scan -on November 2021 was negative for iodine avid distant metastasis.   -Will be considered for previsit thyroid/neck surveillance ultrasound before her next visit in 6 months.      2  Post thyroidectomy Hypothyroidism -Her recent thyroid function  tests are   consistent with appropriate replacement.  She is advised to continue levothyroxine 88 mcg p.o. daily before breakfast.     - We discussed about the correct intake of her thyroid hormone, on empty stomach at fasting, with water, separated by at least 30 minutes from breakfast and other medications,  and separated by more than 4 hours from calcium, iron, multivitamins, acid reflux medications (PPIs). -Patient is made aware of the fact that thyroid hormone replacement is needed for life, dose to be adjusted by periodic monitoring of thyroid function tests.  3.  Hyperlipidemia: Recent diagnosis Patient was initiated on rosuvastatin 10 mg p.o. nightly.  She returns with controlled lipid panel with HDL of 66, LDL 88.  She is advised to continue Crestor.   She is also counseled on whole food plant-based diet.     - I advised patient to maintain close follow up with Lemmie Evens, MD for primary care needs.   I spent 26 minutes in the care of the patient today including review of labs from Thyroid Function, CMP, and other relevant labs ; imaging/biopsy records (current and previous including abstractions from other facilities); face-to-face time discussing  her lab results and symptoms, medications doses, her options of short and long term treatment based on the latest standards of care / guidelines;   and documenting the encounter.  Joyce Martin  participated in the discussions, expressed understanding, and voiced  agreement with the above plans.  All questions were answered to her satisfaction. she is encouraged to contact clinic should she have any questions or concerns prior to her return visit.    Follow up plan: Return in about 6 months (around 12/20/2022) for F/U with Pre-visit Labs, Thyroid / Neck Ultrasound.  Glade Lloyd, MD Phone: [phone]  Fax: [phone]  -  This note was partially dictated with voice recognition software. Similar sounding words can be transcribed  inadequately or may not  be corrected upon review.  06/20/2022, 4:29 PM

## 2022-06-22 LAB — T4, FREE: Free T4: 1.37 ng/dL (ref 0.82–1.77)

## 2022-06-22 LAB — LIPID PANEL
Chol/HDL Ratio: 2.5 ratio (ref 0.0–4.4)
Cholesterol, Total: 167 mg/dL (ref 100–199)
HDL: 66 mg/dL (ref >39)
LDL Chol Calc (NIH): 88 mg/dL (ref 0–99)
Triglycerides: 65 mg/dL (ref 0–149)
VLDL Cholesterol Cal: 13 mg/dL (ref 5–40)

## 2022-06-22 LAB — TSH: TSH: 3.36 u[IU]/mL (ref 0.450–4.500)

## 2022-06-22 LAB — THYROGLOBULIN LEVEL: Thyroglobulin (TG-RIA): <2 ng/mL

## 2022-06-27 ENCOUNTER — Ambulatory Visit: Payer: Medicare Other | Admitting: Podiatry

## 2022-06-27 ENCOUNTER — Encounter: Payer: Self-pay | Admitting: Podiatry

## 2022-06-27 ENCOUNTER — Ambulatory Visit (INDEPENDENT_AMBULATORY_CARE_PROVIDER_SITE_OTHER): Payer: Medicare Other

## 2022-06-27 DIAGNOSIS — M778 Other enthesopathies, not elsewhere classified: Secondary | ICD-10-CM

## 2022-06-27 DIAGNOSIS — M7752 Other enthesopathy of left foot: Secondary | ICD-10-CM

## 2022-06-27 NOTE — Progress Notes (Signed)
she presents today chief complaint of painful fifth metatarsal phalangeal joint plantar lateral aspect left foot.  States that has been intermittent pain on and off for the past month or so there is some slight redness present she states that shoes seem to feel fine and no injury that she knows of.  Objective: Vital signs stable alert oriented x 3.  She has some mild fluctuance beneath the fifth metatarsal head of the left foot.  Radiographs taken today demonstrate osseously mature individual with mild demineralization.  Mild tailor's bunion deformity.  Assessment: Mild tailor bunion deformity with bursitis.  Plan: Offered her an injection she declined at this point we did discuss appropriate shoe gear with her in depth she understands this and is amenable to follow-up with me as needed.

## 2022-07-31 ENCOUNTER — Encounter (INDEPENDENT_AMBULATORY_CARE_PROVIDER_SITE_OTHER): Payer: Medicare Other | Admitting: Ophthalmology

## 2022-07-31 DIAGNOSIS — H33303 Unspecified retinal break, bilateral: Secondary | ICD-10-CM | POA: Diagnosis not present

## 2022-07-31 DIAGNOSIS — H43813 Vitreous degeneration, bilateral: Secondary | ICD-10-CM | POA: Diagnosis not present

## 2022-08-01 HISTORY — PX: CATARACT EXTRACTION: SUR2

## 2022-09-02 ENCOUNTER — Encounter (INDEPENDENT_AMBULATORY_CARE_PROVIDER_SITE_OTHER): Payer: Medicare Other | Admitting: Ophthalmology

## 2022-09-02 DIAGNOSIS — H33303 Unspecified retinal break, bilateral: Secondary | ICD-10-CM

## 2022-09-02 DIAGNOSIS — H43813 Vitreous degeneration, bilateral: Secondary | ICD-10-CM | POA: Diagnosis not present

## 2022-09-10 ENCOUNTER — Encounter (INDEPENDENT_AMBULATORY_CARE_PROVIDER_SITE_OTHER): Payer: Medicare Other | Admitting: Ophthalmology

## 2022-09-10 DIAGNOSIS — H43813 Vitreous degeneration, bilateral: Secondary | ICD-10-CM | POA: Diagnosis not present

## 2022-09-10 DIAGNOSIS — H33303 Unspecified retinal break, bilateral: Secondary | ICD-10-CM | POA: Diagnosis not present

## 2022-09-12 ENCOUNTER — Other Ambulatory Visit: Payer: Self-pay | Admitting: "Endocrinology

## 2022-09-19 ENCOUNTER — Encounter (INDEPENDENT_AMBULATORY_CARE_PROVIDER_SITE_OTHER): Payer: Medicare Other | Admitting: Ophthalmology

## 2022-09-19 DIAGNOSIS — H33303 Unspecified retinal break, bilateral: Secondary | ICD-10-CM | POA: Diagnosis not present

## 2022-09-19 DIAGNOSIS — H43813 Vitreous degeneration, bilateral: Secondary | ICD-10-CM | POA: Diagnosis not present

## 2022-12-11 ENCOUNTER — Encounter: Payer: Self-pay | Admitting: Family Medicine

## 2022-12-11 ENCOUNTER — Ambulatory Visit (INDEPENDENT_AMBULATORY_CARE_PROVIDER_SITE_OTHER): Payer: Medicare Other | Admitting: Family Medicine

## 2022-12-11 VITALS — BP 142/90 | HR 90 | Ht 68.0 in | Wt 176.0 lb

## 2022-12-11 DIAGNOSIS — Z131 Encounter for screening for diabetes mellitus: Secondary | ICD-10-CM

## 2022-12-11 DIAGNOSIS — E559 Vitamin D deficiency, unspecified: Secondary | ICD-10-CM

## 2022-12-11 DIAGNOSIS — I10 Essential (primary) hypertension: Secondary | ICD-10-CM | POA: Diagnosis not present

## 2022-12-11 DIAGNOSIS — Z13220 Encounter for screening for lipoid disorders: Secondary | ICD-10-CM

## 2022-12-11 DIAGNOSIS — Z1329 Encounter for screening for other suspected endocrine disorder: Secondary | ICD-10-CM

## 2022-12-11 DIAGNOSIS — Z1159 Encounter for screening for other viral diseases: Secondary | ICD-10-CM

## 2022-12-11 NOTE — Progress Notes (Signed)
New Patient Office Visit   Subjective   Patient ID: Joyce Martin, female    DOB: 06/06/1951  Age: 72 y.o. MRN: [id number]  CC:  Chief Complaint  Patient presents with   Establish Care    Patient is here to establish care.     HPI Joyce Martin 72 year old female, presents to establish care. 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  Hypertension: Patient here for  elevated blood pressure. She is exercising patient walks 3-4 miles a day and is adherent to low salt diet.  Blood pressure unknown controlled at home. Patient denies cardiac symptoms chest pain, dyspnea, fatigue, lower extremity edema, palpitations, and tachypnea.  Cardiovascular risk factors: advanced age  dyslipidemia, family history of premature cardiovascular disease, hypertension, and smoking/ tobacco exposure.       Outpatient Encounter Medications as of 12/11/2022  Medication Sig   acetaminophen (TYLENOL) 650 MG CR tablet Take 650 mg by mouth every 8 (eight) hours as needed for pain.   amLODipine (NORVASC) 5 MG tablet Take 1 tablet (5 mg total) by mouth daily.   levothyroxine (SYNTHROID) 88 MCG tablet TAKE 1 TABLET BY MOUTH EVERY MORNING   losartan (COZAAR) 25 MG tablet Take 1 tablet (25 mg total) by mouth daily.   Propylene Glycol (SYSTANE COMPLETE) 0.6 % SOLN Place 1 drop into both eyes daily as needed (dry eyes).   rosuvastatin (CRESTOR) 10 MG tablet Take 1 tablet (10 mg total) by mouth daily.   No facility-administered encounter medications on file as of 12/11/2022.    Past Surgical History:  Procedure Laterality Date   COLONOSCOPY N/A 04/18/2016   Procedure: COLONOSCOPY;  Surgeon: West Bali, MD;  Location: [facility];  Service: Endoscopy;  Laterality: N/A;  9:30 AM   COLONOSCOPY WITH PROPOFOL N/A 06/29/2021   Procedure: COLONOSCOPY  WITH PROPOFOL;  Surgeon: Lanelle Bal, DO;  Location: [facility];  Service: Endoscopy;  Laterality: N/A;  2:30 / ASA II   POLYPECTOMY  06/29/2021   Procedure: POLYPECTOMY;  Surgeon: Lanelle Bal, DO;  Location: [facility];  Service: Endoscopy;;   REFRACTIVE SURGERY Bilateral 03/15/2021   THYROIDECTOMY N/A 02/10/2017   Procedure: THYROIDECTOMY;  Surgeon: Franky Macho, MD;  Location: [facility];  Service: General;  Laterality: N/A;   TUBAL LIGATION      Review of Systems  Constitutional:  Negative for chills and fever.  HENT:  Negative for ear pain.   Eyes:  Negative for blurred vision.  Respiratory:  Negative for shortness of breath.   Cardiovascular:  Negative for chest pain.  Gastrointestinal:  Negative for heartburn.  Genitourinary:  Negative for dysuria.  Musculoskeletal:  Negative for myalgias.  Skin:  Negative for itching and rash.  Neurological:  Negative for dizziness and headaches.      Objective    BP (!) 142/90   Pulse 90   Ht 5\' 8"  (1.727 m)   Wt 176 lb (79.8 kg)   SpO2 96%   BMI 26.76 kg/m   Physical Exam Vitals reviewed.  Constitutional:      General: She is not in acute distress.    Appearance: Normal appearance. She is not ill-appearing, toxic-appearing or diaphoretic.  HENT:     Head: Normocephalic.  Eyes:     General:        Right eye: No discharge.  Left eye: No discharge.     Conjunctiva/sclera: Conjunctivae normal.  Cardiovascular:     Rate and Rhythm: Normal rate.     Pulses: Normal pulses.     Heart sounds: Normal heart sounds.  Pulmonary:     Effort: Pulmonary effort is normal. No respiratory distress.     Breath sounds: Normal breath sounds.  Musculoskeletal:        General: Normal range of motion.     Cervical back: Normal range of motion.  Skin:    General: Skin is warm and dry.     Capillary Refill: Capillary refill takes less than 2 seconds.  Neurological:     General: No focal deficit present.     Mental  Status: She is alert and oriented to person, place, and time.     Coordination: Coordination normal.     Gait: Gait normal.  Psychiatric:        Mood and Affect: Mood normal.        Behavior: Behavior normal.       Assessment & Plan:  Primary hypertension Assessment & Plan: Vitals:   12/11/22 1545 12/11/22 1551  BP: (!) 155/89 (!) 142/90   Blood pressure not controlled in today's visit Patient reported taking amlodipine 5 mg and Losartan 25 mg daily Follow up in 2 weeks with at home B/P readings  Continued discussion on DASH diet, low sodium diet and maintain a exercise routine for 150 minutes per week.   Orders: -     CBC with Differential/Platelet -     CMP14+EGFR -     Microalbumin / creatinine urine ratio  Vitamin D deficiency -     VITAMIN D 25 Hydroxy (Vit-D Deficiency, Fractures)  Need for hepatitis C screening test -     Hepatitis C antibody  Screening for thyroid disorder -     TSH + free T4  Screening for lipid disorders -     Lipid panel  Screening for diabetes mellitus -     Hemoglobin A1c    Return in about 2 weeks (around 12/25/2022) for re-check blood pressure, hypertension.   Cruzita Lederer Newman Nip, FNP

## 2022-12-11 NOTE — Assessment & Plan Note (Signed)
Vitals:   12/11/22 1545 12/11/22 1551  BP: (!) 155/89 (!) 142/90   Blood pressure not controlled in today's visit Patient reported taking amlodipine 5 mg and Losartan 25 mg daily Follow up in 2 weeks with at home B/P readings  Continued discussion on DASH diet, low sodium diet and maintain a exercise routine for 150 minutes per week.

## 2022-12-11 NOTE — Patient Instructions (Signed)

## 2022-12-17 ENCOUNTER — Other Ambulatory Visit: Payer: Self-pay | Admitting: "Endocrinology

## 2022-12-17 DIAGNOSIS — Z85850 Personal history of malignant neoplasm of thyroid: Secondary | ICD-10-CM

## 2022-12-17 DIAGNOSIS — E890 Postprocedural hypothyroidism: Secondary | ICD-10-CM

## 2022-12-17 LAB — LIPID PANEL
Cholesterol: 157 (ref 0–200)
HDL: 77 — AB (ref 35–70)
LDL Cholesterol: 66
Triglycerides: 74 (ref 40–160)

## 2022-12-17 LAB — TSH: TSH: 13 — AB (ref 0.41–5.90)

## 2022-12-18 LAB — CMP14+EGFR
AST: 17 IU/L (ref 0–40)
Albumin: 4.3 g/dL (ref 3.8–4.8)
Alkaline Phosphatase: 70 IU/L (ref 44–121)
BUN/Creatinine Ratio: 9 — ABNORMAL LOW (ref 12–28)
BUN: 11 mg/dL (ref 8–27)
Calcium: 9.2 mg/dL (ref 8.7–10.3)
Creatinine, Ser: 1.17 mg/dL — ABNORMAL HIGH (ref 0.57–1.00)
Globulin, Total: 2.7 g/dL (ref 1.5–4.5)

## 2022-12-18 LAB — MICROALBUMIN / CREATININE URINE RATIO

## 2022-12-18 LAB — HEMOGLOBIN A1C: Hgb A1c MFr Bld: 5.8 % — ABNORMAL HIGH (ref 4.8–5.6)

## 2022-12-19 LAB — CMP14+EGFR
ALT: 11 IU/L (ref 0–32)
Bilirubin Total: 0.6 mg/dL (ref 0.0–1.2)
CO2: 23 mmol/L (ref 20–29)
Chloride: 106 mmol/L (ref 96–106)
Glucose: 83 mg/dL (ref 70–99)
Potassium: 4.3 mmol/L (ref 3.5–5.2)
Sodium: 143 mmol/L (ref 134–144)
Total Protein: 7 g/dL (ref 6.0–8.5)
eGFR: 49 mL/min/{1.73_m2} — ABNORMAL LOW (ref >59)

## 2022-12-19 LAB — LIPID PANEL
Chol/HDL Ratio: 2 ratio (ref 0.0–4.4)
Cholesterol, Total: 157 mg/dL (ref 100–199)
HDL: 77 mg/dL (ref >39)
LDL Chol Calc (NIH): 66 mg/dL (ref 0–99)
Triglycerides: 74 mg/dL (ref 0–149)
VLDL Cholesterol Cal: 14 mg/dL (ref 5–40)

## 2022-12-19 LAB — CBC WITH DIFFERENTIAL/PLATELET
Basophils Absolute: 0 10*3/uL (ref 0.0–0.2)
Basos: 1 %
EOS (ABSOLUTE): 0.1 10*3/uL (ref 0.0–0.4)
Eos: 2 %
Hematocrit: 34.3 % (ref 34.0–46.6)
Hemoglobin: 11.4 g/dL (ref 11.1–15.9)
Immature Grans (Abs): 0 10*3/uL (ref 0.0–0.1)
Immature Granulocytes: 0 %
Lymphocytes Absolute: 1.3 10*3/uL (ref 0.7–3.1)
Lymphs: 38 %
MCH: 28.2 pg (ref 26.6–33.0)
MCHC: 33.2 g/dL (ref 31.5–35.7)
MCV: 85 fL (ref 79–97)
Monocytes Absolute: 0.2 10*3/uL (ref 0.1–0.9)
Monocytes: 6 %
Neutrophils Absolute: 1.8 10*3/uL (ref 1.4–7.0)
Neutrophils: 53 %
Platelets: 248 10*3/uL (ref 150–450)
RBC: 4.04 x10E6/uL (ref 3.77–5.28)
RDW: 13.2 % (ref 11.7–15.4)
WBC: 3.4 10*3/uL (ref 3.4–10.8)

## 2022-12-19 LAB — HEPATITIS C ANTIBODY: Hep C Virus Ab: NONREACTIVE

## 2022-12-19 LAB — VITAMIN D 25 HYDROXY (VIT D DEFICIENCY, FRACTURES): Vit D, 25-Hydroxy: 11.7 ng/mL — ABNORMAL LOW (ref 30.0–100.0)

## 2022-12-19 LAB — MICROALBUMIN / CREATININE URINE RATIO
Creatinine, Urine: 145.1 mg/dL
Microalbumin, Urine: 10.3 ug/mL

## 2022-12-19 LAB — TSH+FREE T4
Free T4: 1.25 ng/dL (ref 0.82–1.77)
TSH: 13 u[IU]/mL — ABNORMAL HIGH (ref 0.450–4.500)

## 2022-12-19 LAB — HEMOGLOBIN A1C
Est. average glucose Bld gHb Est-mCnc: 120 mg/dL
Hemoglobin A1C: 5.8

## 2022-12-21 LAB — TSH: TSH: 15.6 u[IU]/mL — ABNORMAL HIGH (ref 0.450–4.500)

## 2022-12-21 LAB — THYROGLOBULIN ANTIBODY: Thyroglobulin Antibody: <1 IU/mL (ref 0.0–0.9)

## 2022-12-21 LAB — T4, FREE: Free T4: 1.29 ng/dL (ref 0.82–1.77)

## 2022-12-21 NOTE — Progress Notes (Signed)
Please inform patient,   Your eGFR levels indicates mild kidney function, I advise to keep your hypertension controlled, maintain blood pressure reading goals under 130/80, take your daily blood pressure medications.  Keep your cholesterol under control to prevent further damage to blood vessels. Avoid NSAIDs medications and take tylenol for pain management.Consume a kidney friendly diet which includes Veggies: cauliflower, onions, eggplant, turnips. Low sodium, low to moderate intake of proteins: lean meats (poultry, fish), eggs, unsalted seafood. Avoid fatty foods, limit or avoid smoking and alcohol intake.   Hemoglobin A1c 5.8  indicates prediabetes - no medication intervention just lifestyle changes  It is important to follow a DASH diet which includes vegetables,fruits,whole grains, fat free or low fat diary,fish,poultry,beans,nuts and seeds,vegetable oils. Find an activity that you will enjoyandstart to be active at least 5 days a week for 30 minutes each day.    Vitamin D levels low, I advise to taking  over the counter supplements of vitamin D 1000 IU/day to prevent low vitamin D levels. Consuming Vitamin D rich food sources include fish, salmon, sardines, egg yolks, red meat, liver, oranges, soy milk.   Thyroid panel elevated we will need repeat labs in our next visit fasting.

## 2022-12-23 ENCOUNTER — Ambulatory Visit: Payer: Medicare Other | Admitting: "Endocrinology

## 2022-12-23 ENCOUNTER — Encounter: Payer: Self-pay | Admitting: "Endocrinology

## 2022-12-23 VITALS — BP 106/68 | HR 88 | Ht 68.0 in | Wt 175.8 lb

## 2022-12-23 DIAGNOSIS — Z85850 Personal history of malignant neoplasm of thyroid: Secondary | ICD-10-CM

## 2022-12-23 DIAGNOSIS — E559 Vitamin D deficiency, unspecified: Secondary | ICD-10-CM | POA: Diagnosis not present

## 2022-12-23 DIAGNOSIS — E890 Postprocedural hypothyroidism: Secondary | ICD-10-CM

## 2022-12-23 DIAGNOSIS — E782 Mixed hyperlipidemia: Secondary | ICD-10-CM

## 2022-12-23 MED ORDER — VITAMIN D 50 MCG (2000 UT) PO CAPS: 1.0000 | ORAL_CAPSULE | Freq: Every day | ORAL | 3 refills | Status: AC

## 2022-12-23 MED ORDER — LEVOTHYROXINE SODIUM 100 MCG PO TABS: 100.0000 ug | ORAL_TABLET | Freq: Every day | ORAL | 1 refills | Status: DC

## 2022-12-23 NOTE — Progress Notes (Signed)
12/23/2022             Endocrinology follow-up note   Subjective:    Patient ID: Joyce Martin, female    DOB: 11-14-1950, PCP Del Nigel Berthold, FNP   Past Medical History:  Diagnosis Date   Elevated BP 07/26/2014   GERD (gastroesophageal reflux disease)    Migraines    Positive test for human papillomavirus (HPV) 2014   on pap   Thyroid cancer (HCC) 11/2016   Past Surgical History:  Procedure Laterality Date   CATARACT EXTRACTION Bilateral 08/2022   COLONOSCOPY N/A 04/18/2016   Procedure: COLONOSCOPY;  Surgeon: West Bali, MD;  [facility]: [facility];  Service: Endoscopy;  Laterality: N/A;  9:30 AM   COLONOSCOPY WITH PROPOFOL N/A 06/29/2021   Procedure: COLONOSCOPY WITH PROPOFOL;  Surgeon: Lanelle Bal, DO;  [facility];  Service: Endoscopy;  Laterality: N/A;  2:30 / ASA II   POLYPECTOMY  06/29/2021   Procedure: POLYPECTOMY;  Surgeon: Lanelle Bal, DO;  [facility];  Service: Endoscopy;;   REFRACTIVE SURGERY Bilateral 03/15/2021   THYROIDECTOMY N/A 02/10/2017   Procedure: THYROIDECTOMY;  Surgeon: Franky Macho, MD;  Location: [facility];  Service: General;  Laterality: N/A;   TUBAL LIGATION     Social History   Socioeconomic History   Marital status: Divorced    Spouse name: Not on file   Number of children: Not on file   Years of education: Not on file   Highest education level: Not on file  Occupational History   Not on file  Tobacco Use   Smoking status: Former    Packs/day: 0.25    Years: 5.00    Additional pack years: 0.00    Total pack years: 1.25    Types: Cigarettes    Quit date: 01/30/1979    Years since quitting: 43.9   Smokeless tobacco: Never  Vaping Use   Vaping Use: Never used  Substance and Sexual Activity   Alcohol use: No   Drug use: No   Sexual activity: Yes    Birth control/protection: Post-menopausal  Other Topics Concern   Not on file  Social History Narrative   Not on file   Social  Determinants of Health   Financial Resource Strain: Not on file  Food Insecurity: Not on file  Transportation Needs: Not on file  Physical Activity: Not on file  Stress: Not on file  Social Connections: Not on file   Outpatient Encounter Medications as of 12/23/2022  Medication Sig   [DISCONTINUED] Cholecalciferol (VITAMIN D) 50 MCG (2000 UT) CAPS Take 1 capsule by mouth daily with lunch.   acetaminophen (TYLENOL) 650 MG CR tablet Take 650 mg by mouth every 8 (eight) hours as needed for pain.   amLODipine (NORVASC) 5 MG tablet Take 1 tablet (5 mg total) by mouth daily.   Cholecalciferol (VITAMIN D) 50 MCG (2000 UT) CAPS Take 1 capsule (2,000 Units total) by mouth daily with lunch.   levothyroxine (SYNTHROID) 100 MCG tablet Take 1 tablet (100 mcg total) by mouth daily before breakfast.   losartan (COZAAR) 25 MG tablet Take 1 tablet (25 mg total) by mouth daily.   Propylene Glycol (SYSTANE COMPLETE) 0.6 % SOLN Place 1 drop into both eyes daily as needed (dry eyes).   rosuvastatin (CRESTOR) 10 MG tablet Take 1 tablet (10 mg total) by mouth daily.   [DISCONTINUED] levothyroxine (SYNTHROID) 88 MCG tablet TAKE 1 TABLET BY MOUTH EVERY MORNING  No facility-administered encounter medications on file as of 12/23/2022.   ALLERGIES: Allergies  Allergen Reactions   Aspirin Nausea Only   Other Other (See Comments)    COMBID: for stomach -twisting pt's face     VACCINATION STATUS: Immunization History  Administered Date(s) Administered   Influenza, High Dose Seasonal PF 03/10/2019   Tdap 02/28/2011    HPI  72 year old female patient with medical history as above.  She is being seen in follow-up for  postsurgical hypothyroidism, history of papillary thyroid cancer.   -Her history starts in 2018 when she was found to have abnormal fine-needle aspiration of thyroid nodule. She underwent total thyroidectomy on 02/10/2017 which revealed papillary thyroid cancer follicular variant. -Subsequently,  she underwent  I-131 thyroid ablation with Thyrogen stimulation followed by whole body scan which revealed some thyroid remnant in the neck with no evidence of distant metastasis on 05/05/2017. January 2020 thyroid/neck ultrasound was unremarkable. -Her  Thyrogen stimulated whole-body scan on May 08, 2020 was consistent with no evidence of iodine avid distant metastasis. - She is currently on levothyroxine 88 g by mouth every morning.  She reports compliance to this medication.  She has no new complaints today. Her pre-visit TFTs are consistent with slight under replacement.      - She denies dysphagia, shortness of breath, voice change. - She has steady body weight over the last several months.  - She denies palpitations, heat/cold intolerance, and tremors.  Review of Systems  Limited as above. Objective:    BP 106/68   Pulse 88   Ht 5\' 8"  (1.727 m)   Wt 175 lb 12.8 oz (79.7 kg)   BMI 26.73 kg/m   Wt Readings from Last 3 Encounters:  12/23/22 175 lb 12.8 oz (79.7 kg)  12/11/22 176 lb (79.8 kg)  06/20/22 164 lb (74.4 kg)    Physical Exam   1)  - Dedicated thyroid ultrasound showed 2.3 cm nodule on the left lobe and 2 nodules on the isthmus (0.8 cm and 0.7 cm.)  2)  Fine needle aspiration  Diagnosis on 01/09/2017 showed: THYROID, FINE NEEDLE ASPIRATION, LEFT (SPECIMEN 1 OF 1 COLLECTED 71/12/18) ATYPIA OF UNDETERMINED SIGNIFICANCE OR FOLLICULAR LESION OF UNDETERMINED SIGNIFICANCE (B[place]ESDA CATEGORY III).  3) Thyroid, thyroidectomy- 02/10/2017. 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 - TUMOR CONFINED WITHIN RENAL CAPSULE. - SEPARATE ADENOMATOUS NODULES. - BENIGN PARATHYROID GLAND. 4) post therapy for body scan from 05/05/2017 showed  Thyroid remnant.  No scintigraphic evidence of iodine-avid metastatic thyroid cancer.   Thyrogen stimulated whole-body scan on May 08, 2020. FINDINGS: No residual radioiodine uptake in the cervical region.    Excreted tracer within colon, urinary bladder, minimally in stomach.   No abnormal sites of radio iodine accumulation to suggest iodine-avid metastatic thyroid cancer.   IMPRESSION: No scintigraphic evidence of iodine-avid metastatic thyroid cancer.    Recent Results (from the past 2160 hour(s))  Lipid panel     Status: Abnormal   Collection Time: 12/17/22 12:00 AM  Result Value Ref Range   Triglycerides 74 40 - 160   Cholesterol 157 0 - 200   HDL 77 (A) 35 - 70   LDL Cholesterol 66   TSH     Status: Abnormal   Collection Time: 12/17/22 12:00 AM  Result Value Ref Range   TSH 13.00 (A) 0.41 - 5.90    Comment: T4,FREE 1.25  CBC with Differential/Platelet     Status: None   Collection Time: 12/17/22 10:13 AM  Result Value  Ref Range   WBC 3.4 3.4 - 10.8 x10E3/uL   RBC 4.04 3.77 - 5.28 x10E6/uL   Hemoglobin 11.4 11.1 - 15.9 g/dL   Hematocrit 16.1 09.6 - 46.6 %   MCV 85 79 - 97 fL   MCH 28.2 26.6 - 33.0 pg   MCHC 33.2 31.5 - 35.7 g/dL   RDW 04.5 40.9 - 81.1 %   Platelets 248 150 - 450 x10E3/uL   Neutrophils 53 Not Estab. %   Lymphs 38 Not Estab. %   Monocytes 6 Not Estab. %   Eos 2 Not Estab. %   Basos 1 Not Estab. %   Neutrophils Absolute 1.8 1.4 - 7.0 x10E3/uL   Lymphocytes Absolute 1.3 0.7 - 3.1 x10E3/uL   Monocytes Absolute 0.2 0.1 - 0.9 x10E3/uL   EOS (ABSOLUTE) 0.1 0.0 - 0.4 x10E3/uL   Basophils Absolute 0.0 0.0 - 0.2 x10E3/uL   Immature Granulocytes 0 Not Estab. %   Immature Grans (Abs) 0.0 0.0 - 0.1 x10E3/uL  CMP14+EGFR     Status: Abnormal   Collection Time: 12/17/22 10:13 AM  Result Value Ref Range   Glucose 83 70 - 99 mg/dL   BUN 11 8 - 27 mg/dL   Creatinine, Ser 9.14 (H) 0.57 - 1.00 mg/dL   eGFR 49 (L) >78 GN/FAO/1.30   BUN/Creatinine Ratio 9 (L) 12 - 28   Sodium 143 134 - 144 mmol/L   Potassium 4.3 3.5 - 5.2 mmol/L   Chloride 106 96 - 106 mmol/L   CO2 23 20 - 29 mmol/L   Calcium 9.2 8.7 - 10.3 mg/dL   Total Protein 7.0 6.0 - 8.5 g/dL   Albumin 4.3  3.8 - 4.8 g/dL   Globulin, Total 2.7 1.5 - 4.5 g/dL   Bilirubin Total 0.6 0.0 - 1.2 mg/dL   Alkaline Phosphatase 70 44 - 121 IU/L   AST 17 0 - 40 IU/L   ALT 11 0 - 32 IU/L  Hemoglobin A1c     Status: Abnormal   Collection Time: 12/17/22 10:13 AM  Result Value Ref Range   Hgb A1c MFr Bld 5.8 (H) 4.8 - 5.6 %    Comment:          Prediabetes: 5.7 - 6.4          Diabetes: >6.4          Glycemic control for adults with diabetes: <7.0    Est. average glucose Bld gHb Est-mCnc 120 mg/dL  Lipid panel     Status: None   Collection Time: 12/17/22 10:13 AM  Result Value Ref Range   Cholesterol, Total 157 100 - 199 mg/dL   Triglycerides 74 0 - 149 mg/dL   HDL 77 >86 mg/dL   VLDL Cholesterol Cal 14 5 - 40 mg/dL   LDL Chol Calc (NIH) 66 0 - 99 mg/dL   Chol/HDL Ratio 2.0 0.0 - 4.4 ratio    Comment:                                   T. Chol/HDL Ratio                                             Men  Women  1/2 Avg.Risk  3.4    3.3                                   Avg.Risk  5.0    4.4                                2X Avg.Risk  9.6    7.1                                3X Avg.Risk 23.4   11.0   Microalbumin / creatinine urine ratio     Status: None   Collection Time: 12/17/22 10:13 AM  Result Value Ref Range   Creatinine, Urine 145.1 Not Estab. mg/dL   Microalbumin, Urine 78.2 Not Estab. ug/mL   Microalb/Creat Ratio 7 0 - 29 mg/g creat    Comment:                        Normal:                0 -  29                        Moderately increased: 30 - 300                        Severely increased:       >300   TSH + free T4     Status: Abnormal   Collection Time: 12/17/22 10:13 AM  Result Value Ref Range   TSH 13.000 (H) 0.450 - 4.500 uIU/mL   Free T4 1.25 0.82 - 1.77 ng/dL  Hepatitis C Antibody     Status: None   Collection Time: 12/17/22 10:13 AM  Result Value Ref Range   Hep C Virus Ab Non Reactive Non Reactive    Comment: HCV antibody alone does not  differentiate between previously resolved infection and active infection. Equivocal and Reactive HCV antibody results should be followed up with an HCV RNA test to support the diagnosis of active HCV infection.   Vitamin D (25 hydroxy)     Status: Abnormal   Collection Time: 12/17/22 10:13 AM  Result Value Ref Range   Vit D, 25-Hydroxy 11.7 (L) 30.0 - 100.0 ng/mL    Comment: Vitamin D deficiency has been defined by the Institute of Medicine and an Endocrine Society practice guideline as a level of serum 25-OH vitamin D less than 20 ng/mL (1,2). The Endocrine Society went on to further define vitamin D insufficiency as a level between 21 and 29 ng/mL (2). 1. IOM (Institute of Medicine). 2010. Dietary reference    intakes for calcium and D. [place]: The    [employer]. 2. Holick MF, Binkley Ringwood, Bischoff-Ferrari HA, et al.    Evaluation, treatment, and prevention of vitamin D    deficiency: an Endocrine Society clinical practice    guideline. JCEM. 2011 Jul; 96(7):1911-30.   Hemoglobin A1c     Status: None   Collection Time: 12/19/22 12:00 AM  Result Value Ref Range   Hemoglobin A1C 5.8   Thyroglobulin antibody     Status: None   Collection Time: 12/20/22  9:19 AM  Result Value Ref Range  Thyroglobulin Antibody <1.0 0.0 - 0.9 IU/mL    Comment: Thyroglobulin Antibody measured by [employer] Methodology It should be noted that the presence of thyroglobulin antibodies may not be pathogenic nor diagnostic, especially at very low levels. The assay manufacturer has found that four percent of individuals without evidence of thyroid disease or autoimmunity will have positive TgAb levels up to 4 IU/mL.   T4, Free     Status: None   Collection Time: 12/20/22  9:19 AM  Result Value Ref Range   Free T4 1.29 0.82 - 1.77 ng/dL  TSH     Status: Abnormal   Collection Time: 12/20/22  9:19 AM  Result Value Ref Range   TSH 15.600 (H) 0.450 - 4.500 uIU/mL    Assessment  & Plan:   1. Follicular variant of Papillary Thyroid Cancer - Her 02/10/2017 sub total thyroidectomy revealed 0.4 cm unifocal , circumscribed, follicular variant of papillary thyroid cancer.TNM code: pT1a, pNx  -  Due to the fact that it is follicular variant and her relative youth, she was given adjuvant   thyroid remnant ablation utilizing I-131 with post therapy whole-body scan. This showed some thyroid remnant in the neck with no evidence of distant iodine avid metastasis.  -  Her stimulated thyroglobulin levels was detectable at 37. This warrants continuous surveillance for tumor recurrence. -Her most recent unstimulated thyroglobulin level was undetectable at less than 0.1. -She underwent surveillance thyroid/neck ultrasound in January 2020 which was negative for recurrent or residual tissue and no evidence of abnormal adenopathy.  -Her thyroglobulin level is less than 0.1, with undetectable antithyroglobulin antibodies. - Thyrogen stimulated whole-body scan -on November 2021 was negative for iodine avid distant metastasis.   -She will be considered for normal surveillance thyroid/neck ultrasound before her next visit in 6 months.     2  Post thyroidectomy Hypothyroidism -Her recent thyroid function tests are consistent with slight under replacement.  I discussed and increase her levothyroxine to 100 mcg p.o. daily before breakfast.   - We discussed about the correct intake of her thyroid hormone, on empty stomach at fasting, with water, separated by at least 30 minutes from breakfast and other medications,  and separated by more than 4 hours from calcium, iron, multivitamins, acid reflux medications (PPIs). -Patient is made aware of the fact that thyroid hormone replacement is needed for life, dose to be adjusted by periodic monitoring of thyroid function tests.   3.  Hyperlipidemia: Recent diagnosis Patient was initiated on rosuvastatin 10 mg p.o. nightly.  She returns with controlled  lipid panel with HDL of 66, LDL 88.  She is advised to continue Crestor.   She is also counseled on whole food plant-based diet.    4.  Vitamin D deficiency: I discussed and initiated her on vitamin D3 2000 units daily.  - I advised patient to maintain close follow up with Del Nigel Berthold, FNP for primary care needs.    I spent  21  minutes in the care of the patient today including review of labs from Thyroid Function, CMP, and other relevant labs ; imaging/biopsy records (current and previous including abstractions from other facilities); face-to-face time discussing  her lab results and symptoms, medications doses, her options of short and long term treatment based on the latest standards of care / guidelines;   and documenting the encounter.  Joyce Martin  participated in the discussions, expressed understanding, and voiced agreement with the above plans.  All questions were answered to  her satisfaction. she is encouraged to contact clinic should she have any questions or concerns prior to her return visit.    Follow up plan: Return in about 6 months (around 06/24/2023) for Fasting Labs  in AM B4 8, Thyroid / Neck Ultrasound.  Marquis Lunch, MD Phone: [phone]  Fax: [phone]  -  This note was partially dictated with voice recognition software. Similar sounding words can be transcribed inadequately or may not  be corrected upon review.  12/23/2022, 5:23 PM

## 2022-12-26 ENCOUNTER — Encounter: Payer: Self-pay | Admitting: Family Medicine

## 2022-12-26 ENCOUNTER — Ambulatory Visit (INDEPENDENT_AMBULATORY_CARE_PROVIDER_SITE_OTHER): Payer: Medicare Other | Admitting: Family Medicine

## 2022-12-26 VITALS — BP 119/80 | HR 78 | Temp 98.4°F | Ht 68.0 in | Wt 173.0 lb

## 2022-12-26 DIAGNOSIS — I10 Essential (primary) hypertension: Secondary | ICD-10-CM | POA: Diagnosis not present

## 2022-12-26 MED ORDER — CLONIDINE HCL 0.1 MG PO TABS: 0.1000 mg | ORAL_TABLET | Freq: Once | ORAL | 1 refills | Status: AC | PRN

## 2022-12-26 MED ORDER — CLONIDINE HCL 0.1 MG PO TABS: 0.1000 mg | ORAL_TABLET | Freq: Once | ORAL | 1 refills | Status: DC | PRN

## 2022-12-26 NOTE — Assessment & Plan Note (Signed)
Vitals:   12/26/22 1516 12/26/22 1604  BP: (!) 148/71 119/80  Continue amlodipine 5 mg and Losartan 25 mg daily Started Clonidine 0.1 mg PRN if Blood pressure above 170/100           Explained non pharmacological interventions such as low salt, DASH diet discussed. Educated on stress reduction and physical activity minimum 150 minutes per week. Discussed signs and symptoms of major cardiovascular event and need to present to the ED. Follow up in 3 months or sooner if needed. Patient verbalizes understanding regarding plan of care and all questions answered.

## 2022-12-26 NOTE — Patient Instructions (Signed)
        Great to see you today.  I have refilled the medication(s) we provide.    - Please take medications as prescribed. - Follow up with your primary health provider if any health concerns arises. - If symptoms worsen please contact your primary care provider and/or visit the emergency department.  

## 2022-12-26 NOTE — Progress Notes (Signed)
Patient Office Visit   Subjective   Patient ID: Joyce Martin, female    DOB: 03-Aug-1950  Age: 72 y.o. MRN: [id number]  CC:  Chief Complaint  Patient presents with   Follow-up    Return in about 2 weeks (around 12/25/2022) for re-check blood pressure, hypertension.    HPI Joyce Martin 72 year old female, presents to the clinic for HTN follow up. 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   HPI    Outpatient Encounter Medications as of 12/26/2022  Medication Sig   acetaminophen (TYLENOL) 650 MG CR tablet Take 650 mg by mouth every 8 (eight) hours as needed for pain.   Cholecalciferol (VITAMIN D) 50 MCG (2000 UT) CAPS Take 1 capsule (2,000 Units total) by mouth daily with lunch.   levothyroxine (SYNTHROID) 100 MCG tablet Take 1 tablet (100 mcg total) by mouth daily before breakfast.   losartan (COZAAR) 25 MG tablet Take 1 tablet (25 mg total) by mouth daily.   Propylene Glycol (SYSTANE COMPLETE) 0.6 % SOLN Place 1 drop into both eyes daily as needed (dry eyes).   rosuvastatin (CRESTOR) 10 MG tablet Take 1 tablet (10 mg total) by mouth daily.   [DISCONTINUED] amLODipine (NORVASC) 5 MG tablet Take 1 tablet (5 mg total) by mouth daily.   [DISCONTINUED] cloNIDine (CATAPRES) 0.1 MG tablet Take 1 tablet (0.1 mg total) by mouth once as needed for up to 1 dose. Take one tablet if blood pressure above 160/90   cloNIDine (CATAPRES) 0.1 MG tablet Take 1 tablet (0.1 mg total) by mouth once as needed for up to 1 dose. Take one tablet if blood pressure above 170/100   No facility-administered encounter medications on file as of 12/26/2022.    Past Surgical History:  Procedure Laterality Date   CATARACT EXTRACTION Bilateral 08/2022   COLONOSCOPY N/A 04/18/2016    Procedure: COLONOSCOPY;  Surgeon: West Bali, MD;  [facility];  Service: Endoscopy;  Laterality: N/A;  9:30 AM   COLONOSCOPY WITH PROPOFOL N/A 06/29/2021   Procedure: COLONOSCOPY WITH PROPOFOL;  Surgeon: Lanelle Bal, DO;  [facility]: [facility];  Service: Endoscopy;  Laterality: N/A;  2:30 / ASA II   POLYPECTOMY  06/29/2021   Procedure: POLYPECTOMY;  Surgeon: Lanelle Bal, DO;  [facility]: [facility];  Service: Endoscopy;;   REFRACTIVE SURGERY Bilateral 03/15/2021   THYROIDECTOMY N/A 02/10/2017   Procedure: THYROIDECTOMY;  Surgeon: Franky Macho, MD;  Location: [facility];  Service: General;  Laterality: N/A;   TUBAL LIGATION      Review of Systems  Constitutional:  Negative for chills and fever.  Respiratory:  Negative for shortness of breath.   Cardiovascular:  Negative for chest pain.  Gastrointestinal:  Negative for abdominal pain.  Genitourinary:  Negative for dysuria.  Neurological:  Negative for dizziness and headaches.      Objective    BP 119/80   Pulse 78   Temp 98.4 F (36.9 C) (Oral)   Ht 5\' 8"  (1.727 m)   Wt 173 lb (78.5 kg)   SpO2 98%   BMI 26.30 kg/m   Physical Exam Vitals reviewed.  Constitutional:      General: She is not in acute distress.    Appearance: Normal appearance. She is not ill-appearing, toxic-appearing or diaphoretic.  HENT:     Head: Normocephalic.  Eyes:     General:        Right eye: No discharge.        Left eye: No discharge.     Conjunctiva/sclera: Conjunctivae normal.  Cardiovascular:     Rate and Rhythm: Normal rate.     Pulses: Normal pulses.     Heart sounds: Normal heart sounds.  Pulmonary:     Effort: Pulmonary effort is normal. No respiratory distress.     Breath sounds: Normal breath sounds.  Abdominal:     General: Bowel sounds are normal.     Palpations: Abdomen is soft.     Tenderness: There is no abdominal tenderness. There is no right CVA tenderness, left CVA tenderness or guarding.   Musculoskeletal:        General: Normal range of motion.     Cervical back: Normal range of motion.  Skin:    General: Skin is warm and dry.     Capillary Refill: Capillary refill takes less than 2 seconds.  Neurological:     General: No focal deficit present.     Mental Status: She is alert and oriented to person, place, and time.     Coordination: Coordination normal.     Gait: Gait normal.  Psychiatric:        Mood and Affect: Mood normal.        Behavior: Behavior normal.       Assessment & Plan:  Primary hypertension Assessment & Plan: Vitals:   12/26/22 1516 12/26/22 1604  BP: (!) 148/71 119/80  Continue amlodipine 5 mg and Losartan 25 mg daily Started Clonidine 0.1 mg PRN if Blood pressure above 170/100           Explained non pharmacological interventions such as low salt, DASH diet discussed. Educated on stress reduction and physical activity minimum 150 minutes per week. Discussed signs and symptoms of major cardiovascular event and need to present to the ED. Follow up in 3 months or sooner if needed. Patient verbalizes understanding regarding plan of care and all questions answered.    Other orders -     cloNIDine HCl; Take 1 tablet (0.1 mg total) by mouth once as needed for up to 1 dose. Take one tablet if blood pressure above 170/100  Dispense: 90 tablet; Refill: 1    Return in about 4 months (around 04/27/2023) for hypertension, routine labs, chronic follow-up.   Cruzita Lederer Newman Nip, FNP

## 2023-01-07 ENCOUNTER — Encounter (INDEPENDENT_AMBULATORY_CARE_PROVIDER_SITE_OTHER): Payer: Medicare Other | Admitting: Ophthalmology

## 2023-01-07 ENCOUNTER — Other Ambulatory Visit (HOSPITAL_COMMUNITY): Payer: Medicare Other

## 2023-01-07 DIAGNOSIS — H33303 Unspecified retinal break, bilateral: Secondary | ICD-10-CM | POA: Diagnosis not present

## 2023-01-07 DIAGNOSIS — H43813 Vitreous degeneration, bilateral: Secondary | ICD-10-CM | POA: Diagnosis not present

## 2023-01-08 ENCOUNTER — Ambulatory Visit (HOSPITAL_COMMUNITY)
Admission: RE | Admit: 2023-01-08 | Discharge: 2023-01-08 | Disposition: A | Discharge status: Home / Self Care | Payer: Medicare Other | Source: Ambulatory Visit | Attending: "Endocrinology | Admitting: "Endocrinology

## 2023-01-08 DIAGNOSIS — Z85850 Personal history of malignant neoplasm of thyroid: Secondary | ICD-10-CM | POA: Diagnosis not present

## 2023-01-09 ENCOUNTER — Encounter: Payer: Self-pay | Admitting: Family Medicine

## 2023-01-24 ENCOUNTER — Ambulatory Visit: Payer: Medicare Other

## 2023-01-24 DIAGNOSIS — Z0000 Encounter for general adult medical examination without abnormal findings: Secondary | ICD-10-CM

## 2023-01-24 NOTE — Progress Notes (Signed)
Subjective:   Joyce Martin is a 72 y.o. female who presents for Medicare Annual (Subsequent) preventive examination.  Visit Complete: In person  Patient Medicare AWV questionnaire was completed by the patient on 01/24/2023; I have confirmed that all information answered by patient is correct and no changes since this date.  Review of Systems    N/a       Objective:    There were no vitals filed for this visit. There is no height or weight on file to calculate BMI.     06/29/2021    7:09 AM 02/10/2017    6:23 AM 02/06/2017    7:16 AM 04/18/2016    8:40 AM  Advanced Directives  Does Patient Have a Medical Advance Directive? No No No No  Would patient like information on creating a medical advance directive? Yes (MAU/Ambulatory/Procedural Areas - Information given) No - Patient declined No - Patient declined No - patient declined information    Current Medications (verified) Outpatient Encounter Medications as of 01/24/2023  Medication Sig   acetaminophen (TYLENOL) 650 MG CR tablet Take 650 mg by mouth every 8 (eight) hours as needed for pain.   Cholecalciferol (VITAMIN D) 50 MCG (2000 UT) CAPS Take 1 capsule (2,000 Units total) by mouth daily with lunch.   cloNIDine (CATAPRES) 0.1 MG tablet Take 1 tablet (0.1 mg total) by mouth once as needed for up to 1 dose. Take one tablet if blood pressure above 170/100   levothyroxine (SYNTHROID) 100 MCG tablet Take 1 tablet (100 mcg total) by mouth daily before breakfast.   losartan (COZAAR) 25 MG tablet Take 1 tablet (25 mg total) by mouth daily.   Propylene Glycol (SYSTANE COMPLETE) 0.6 % SOLN Place 1 drop into both eyes daily as needed (dry eyes).   rosuvastatin (CRESTOR) 10 MG tablet Take 1 tablet (10 mg total) by mouth daily.   No facility-administered encounter medications on file as of 01/24/2023.    Allergies (verified) Aspirin and Other   History: Past Medical History:  Diagnosis Date   Elevated BP 07/26/2014   GERD  (gastroesophageal reflux disease)    Migraines    Positive test for human papillomavirus (HPV) 2014   on pap   Thyroid cancer (HCC) 11/2016   Past Surgical History:  Procedure Laterality Date   CATARACT EXTRACTION Bilateral 08/2022   COLONOSCOPY N/A 04/18/2016   Procedure: COLONOSCOPY;  Surgeon: West Bali, MD;  [facility];  Service: Endoscopy;  Laterality: N/A;  9:30 AM   COLONOSCOPY WITH PROPOFOL N/A 06/29/2021   Procedure: COLONOSCOPY WITH PROPOFOL;  Surgeon: Lanelle Bal, DO;  [facility];  Service: Endoscopy;  Laterality: N/A;  2:30 / ASA II   POLYPECTOMY  06/29/2021   Procedure: POLYPECTOMY;  Surgeon: Lanelle Bal, DO;  [facility];  Service: Endoscopy;;   REFRACTIVE SURGERY Bilateral 03/15/2021   THYROIDECTOMY N/A 02/10/2017   Procedure: THYROIDECTOMY;  Surgeon: Franky Macho, MD;  Location: [facility];  Service: General;  Laterality: N/A;   TUBAL LIGATION     Family History  Problem Relation Age of Onset   Diabetes Mother    Hypertension Mother    Heart disease Father        heart attack   Cancer Sister        gallbladder   Diabetes Brother    Diabetes Daughter        borderline   Diabetes Maternal Grandmother    Diabetes Paternal Grandmother  Diabetes Paternal Grandfather    COPD Brother    Hypertension Brother    Parkinson's disease Brother    Cancer Brother    Colon cancer Neg Hx    Social History   Socioeconomic History   Marital status: Divorced    Spouse name: Not on file   Number of children: Not on file   Years of education: Not on file   Highest education level: Not on file  Occupational History   Not on file  Tobacco Use   Smoking status: Former    Current packs/day: 0.00    Average packs/day: 0.3 packs/day for 5.0 years (1.3 ttl pk-yrs)    Types: Cigarettes    Start date: 01/29/1974    Quit date: 01/30/1979    Years since quitting: 44.0   Smokeless tobacco: Never  Vaping Use   Vaping status:  Never Used  Substance and Sexual Activity   Alcohol use: No   Drug use: No   Sexual activity: Yes    Birth control/protection: Post-menopausal  Other Topics Concern   Not on file  Social History Narrative   Not on file   Social Determinants of Health   Financial Resource Strain: Low Risk  (01/24/2023)   Overall Financial Resource Strain (CARDIA)    Difficulty of Paying Living Expenses: Not hard at all  Food Insecurity: No Food Insecurity (01/24/2023)   Hunger Vital Sign    Worried About Running Out of Food in the Last Year: Never true    Ran Out of Food in the Last Year: Never true  Transportation Needs: No Transportation Needs (01/24/2023)   PRAPARE - Administrator, Civil Service (Medical): No    Lack of Transportation (Non-Medical): No  Physical Activity: Sufficiently Active (01/24/2023)   Exercise Vital Sign    Days of Exercise per Week: 5 days    Minutes of Exercise per Session: 60 min  Stress: Not on file  Social Connections: Moderately Isolated (01/24/2023)   Social Connection and Isolation Panel [NHANES]    Frequency of Communication with Friends and Family: More than three times a week    Frequency of Social Gatherings with Friends and Family: More than three times a week    Attends Religious Services: Never    Database administrator or Organizations: Yes    Attends Banker Meetings: Never    Marital Status: Divorced    Tobacco Counseling Counseling given: Not Answered   Clinical Intake:  Pre-visit preparation completed: Yes  Pain : No/denies pain     Diabetes: No  How often do you need to have someone help you when you read instructions, pamphlets, or other written materials from your doctor or pharmacy?: 1 - Never What is the last grade level you completed in school?: 12th         Activities of Daily Living    01/24/2023    2:40 PM  In your present state of health, do you have any difficulty performing the following  activities:  Hearing? 0  Vision? 0  Difficulty concentrating or making decisions? 0  Walking or climbing stairs? 0  Dressing or bathing? 0  Doing errands, shopping? 0  Preparing Food and eating ? N  Using the Toilet? N  In the past six months, have you accidently leaked urine? N  Do you have problems with loss of bowel control? N  Managing your Medications? N  Managing your Finances? N  Housekeeping or managing your Housekeeping? N  Patient Care Team: Del Newman Nip, Tenna Child, FNP as PCP - General (Family Medicine) Lanelle Bal, DO as Consulting Physician (Gastroenterology)  Indicate any recent Medical Services you may have received from other than [facility] providers in the past year (date may be approximate).     Assessment:   This is a routine wellness examination for [place].  Hearing/Vision screen No results found.  Dietary issues and exercise activities discussed:     Goals Addressed   None    Depression Screen    01/24/2023    2:42 PM 12/11/2022    3:50 PM 12/22/2017    2:51 PM 08/21/2017   11:50 AM 06/16/2017    2:22 PM 04/08/2017   10:47 AM 01/15/2017    3:42 PM  PHQ 2/9 Scores  PHQ - 2 Score 0 0 0 0 0 0 0  PHQ- 9 Score 0 0         Fall Risk    12/11/2022    3:49 PM 12/22/2017    2:51 PM 06/16/2017    2:22 PM 04/08/2017   10:47 AM 01/15/2017    3:42 PM  Fall Risk   Falls in the past year? 0 No No No No  Number falls in past yr: 0      Injury with Fall? 0      Risk for fall due to : No Fall Risks      Follow up Falls evaluation completed        MEDICARE RISK AT HOME:   TIMED UP AND GO:  Was the test performed?  No    Cognitive Function:        01/24/2023    2:44 PM  6CIT Screen  What Year? 0 points  What month? 0 points  What time? 0 points  Count back from 20 0 points  Months in reverse 0 points  Repeat phrase 0 points  Total Score 0 points    Immunizations Immunization History  Administered Date(s) Administered   Influenza,  High Dose Seasonal PF 03/10/2019   Tdap 02/28/2011    TDAP status: Due, Education has been provided regarding the importance of this vaccine. Advised may receive this vaccine at local pharmacy or Health Dept. Aware to provide a copy of the vaccination record if obtained from local pharmacy or Health Dept. Verbalized acceptance and understanding.  Flu Vaccine status: Up to date  Pneumococcal vaccine status: Due, Education has been provided regarding the importance of this vaccine. Advised may receive this vaccine at local pharmacy or Health Dept. Aware to provide a copy of the vaccination record if obtained from local pharmacy or Health Dept. Verbalized acceptance and understanding.  Covid-19 vaccine status: Information provided on how to obtain vaccines.   Qualifies for Shingles Vaccine? Yes   Zostavax completed  will go to local pharmacy   Shingrix Completed?: No.    Education has been provided regarding the importance of this vaccine. Patient has been advised to call insurance company to determine out of pocket expense if they have not yet received this vaccine. Advised may also receive vaccine at local pharmacy or Health Dept. Verbalized acceptance and understanding.  Screening Tests Health Maintenance  Topic Date Due   Medicare Annual Wellness (AWV)  Never done   Zoster Vaccines- Shingrix (1 of 2) Never done   Pneumonia Vaccine 22+ Years old (1 of 1 - PCV) Never done   DTaP/Tdap/Td (2 - Td or Tdap) 02/27/2021   COVID-19 Vaccine (1) 03/10/2023 (Originally 11/05/1955)  INFLUENZA VACCINE  01/30/2023   MAMMOGRAM  07/05/2023   Colonoscopy  06/30/2031   DEXA SCAN  Completed   Hepatitis C Screening  Completed   HPV VACCINES  Aged Out    Health Maintenance  Health Maintenance Due  Topic Date Due   Medicare Annual Wellness (AWV)  Never done   Zoster Vaccines- Shingrix (1 of 2) Never done   Pneumonia Vaccine 69+ Years old (1 of 1 - PCV) Never done   DTaP/Tdap/Td (2 - Td or Tdap)  02/27/2021    Colorectal cancer screening: Type of screening: Colonoscopy. Completed 06/29/2021. Repeat every 10 years  Mammogram status: Completed 07/04/2021. Repeat every year  Bone Density status: Completed 05/03/2015. Results reflect: Bone density results: NORMAL. Repeat every n/a years.  Lung Cancer Screening: (Low Dose CT Chest recommended if Age 84-80 years, 20 pack-year currently smoking OR have quit w/in 15years.) does not qualify.   Lung Cancer Screening Referral: n/a  Additional Screening:  Hepatitis C Screening: does not qualify; Completed n/a  Vision Screening: Recommended annual ophthalmology exams for early detection of glaucoma and other disorders of the eye. Is the patient up to date with their annual eye exam?  Yes  Who is the provider or what is the name of the office in which the patient attends annual eye exams? N/a If pt is not established with a provider, would they like to be referred to a provider to establish care?  Has eye doctor .   Dental Screening: Recommended annual dental exams for proper oral hygiene   Community Resource Referral / Chronic Care Management: CRR required this visit?    CCM required this visit?       Plan:     I have personally reviewed and noted the following in the patient's chart:   Medical and social history Use of alcohol, tobacco or illicit drugs  Current medications and supplements including opioid prescriptions.  Functional ability and status Nutritional status Physical activity Advanced directives List of other physicians Hospitalizations, surgeries, and ER visits in previous 12 months Vitals Screenings to include cognitive, depression, and falls Referrals and appointments  In addition, I have reviewed and discussed with patient certain preventive protocols, quality metrics, and best practice recommendations. A written personalized care plan for preventive services as well as general preventive health  recommendations were provided to patient.     Herbie Saxon, [place]   01/24/2023   After Visit Summary:   Nurse Notes: n/a

## 2023-02-04 ENCOUNTER — Other Ambulatory Visit: Payer: Self-pay | Admitting: Family Medicine

## 2023-02-04 ENCOUNTER — Telehealth: Payer: Self-pay | Admitting: Family Medicine

## 2023-02-04 MED ORDER — ROSUVASTATIN CALCIUM 5 MG PO TABS: 5.0000 mg | ORAL_TABLET | Freq: Every day | ORAL | 3 refills | Status: DC

## 2023-02-04 NOTE — Telephone Encounter (Signed)
Patient called said if the provider would like for patient to continue this medication if so please call to let the patient know. Patient no longer gets from older provider. [phone].  Prescription Request  02/04/2023  LOV: 12/26/2022  What is the name of the medication or equipment? rosuvastatin (CRESTOR) 10 MG tablet   Have you contacted your pharmacy to request a refill? No   Which pharmacy would you like this sent to?  [facility] [address], [place] - [address] [place] [address] Phone: [phone] Fax: [phone]    Patient notified that their request is being sent to the clinical staff for review and that they should receive a response within 2 business days.   Please advise at Mobile [phone] (mobile)

## 2023-04-27 NOTE — Patient Instructions (Signed)

## 2023-04-27 NOTE — Progress Notes (Unsigned)
Established Patient Office Visit   Subjective  Patient ID: Joyce Martin, female    DOB: 07-Feb-1951  Age: 72 y.o. MRN: [id number]  No chief complaint on file.   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  HPI Patient presents to the clinic for   ROS    Objective:     There were no vitals taken for this visit. {Vitals History (Optional):23777}  Physical Exam   No results found for any visits on 04/28/23.  The 10-year ASCVD risk score (Arnett DK, et al., 2019) is: 10.9%    Assessment & Plan:  There are no diagnoses linked to this encounter.  No follow-ups on file.   Cruzita Lederer Newman Nip, FNP

## 2023-04-28 ENCOUNTER — Ambulatory Visit (INDEPENDENT_AMBULATORY_CARE_PROVIDER_SITE_OTHER): Payer: Medicare Other | Admitting: Family Medicine

## 2023-04-28 ENCOUNTER — Encounter: Payer: Self-pay | Admitting: Family Medicine

## 2023-04-28 VITALS — BP 135/82 | HR 77 | Ht 68.0 in | Wt 177.0 lb

## 2023-04-28 DIAGNOSIS — E038 Other specified hypothyroidism: Secondary | ICD-10-CM

## 2023-04-28 DIAGNOSIS — Z23 Encounter for immunization: Secondary | ICD-10-CM

## 2023-04-28 DIAGNOSIS — I10 Essential (primary) hypertension: Secondary | ICD-10-CM

## 2023-04-28 NOTE — Assessment & Plan Note (Signed)
Continue amlodipine 5 mg and Losartan 25 mg daily  Labs ordered. Discussed with  patient to monitor their blood pressure regularly and maintain a heart-healthy diet rich in fruits, vegetables, whole grains, and low-fat dairy, while reducing sodium intake to less than 2,300 mg per day. Regular physical activity, such as 30 minutes of moderate exercise most days of the week, will help lower blood pressure and improve overall cardiovascular health. Avoiding smoking, limiting alcohol consumption, and managing stress. Take  prescribed medication, & take it as directed and avoid skipping doses. Seek emergency care if your blood pressure is (over 180/100) or you experience chest pain, shortness of breath, or sudden vision changes.Patient verbalizes understanding regarding plan of care and all questions answered.

## 2023-06-14 LAB — BMP8+EGFR
BUN/Creatinine Ratio: 14 (ref 12–28)
BUN: 16 mg/dL (ref 8–27)
CO2: 23 mmol/L (ref 20–29)
Calcium: 9.1 mg/dL (ref 8.7–10.3)
Chloride: 105 mmol/L (ref 96–106)
Creatinine, Ser: 1.17 mg/dL — ABNORMAL HIGH (ref 0.57–1.00)
Glucose: 88 mg/dL (ref 70–99)
Potassium: 4.8 mmol/L (ref 3.5–5.2)
Sodium: 143 mmol/L (ref 134–144)
eGFR: 49 mL/min/{1.73_m2} — ABNORMAL LOW (ref >59)

## 2023-06-14 LAB — CBC WITH DIFFERENTIAL/PLATELET
Basophils Absolute: 0 10*3/uL (ref 0.0–0.2)
Basos: 1 %
EOS (ABSOLUTE): 0.1 10*3/uL (ref 0.0–0.4)
Eos: 2 %
Hematocrit: 33.9 % — ABNORMAL LOW (ref 34.0–46.6)
Hemoglobin: 11.2 g/dL (ref 11.1–15.9)
Immature Grans (Abs): 0 10*3/uL (ref 0.0–0.1)
Immature Granulocytes: 0 %
Lymphocytes Absolute: 1.4 10*3/uL (ref 0.7–3.1)
Lymphs: 40 %
MCH: 29.2 pg (ref 26.6–33.0)
MCHC: 33 g/dL (ref 31.5–35.7)
MCV: 89 fL (ref 79–97)
Monocytes Absolute: 0.3 10*3/uL (ref 0.1–0.9)
Monocytes: 8 %
Neutrophils Absolute: 1.7 10*3/uL (ref 1.4–7.0)
Neutrophils: 49 %
Platelets: 245 10*3/uL (ref 150–450)
RBC: 3.83 x10E6/uL (ref 3.77–5.28)
RDW: 12.8 % (ref 11.7–15.4)
WBC: 3.5 10*3/uL (ref 3.4–10.8)

## 2023-06-14 LAB — LIPID PANEL
Chol/HDL Ratio: 2.2 {ratio} (ref 0.0–4.4)
Cholesterol, Total: 159 mg/dL (ref 100–199)
HDL: 73 mg/dL (ref >39)
LDL Chol Calc (NIH): 75 mg/dL (ref 0–99)
Triglycerides: 55 mg/dL (ref 0–149)
VLDL Cholesterol Cal: 11 mg/dL (ref 5–40)

## 2023-06-14 LAB — TSH: TSH: 6.11 u[IU]/mL — ABNORMAL HIGH (ref 0.450–4.500)

## 2023-06-14 LAB — T4, FREE: Free T4: 1.49 ng/dL (ref 0.82–1.77)

## 2023-06-23 ENCOUNTER — Ambulatory Visit: Payer: Medicare Other | Admitting: "Endocrinology

## 2023-07-16 ENCOUNTER — Telehealth: Payer: Self-pay | Admitting: "Endocrinology

## 2023-07-16 ENCOUNTER — Other Ambulatory Visit: Payer: Self-pay

## 2023-07-16 MED ORDER — LEVOTHYROXINE SODIUM 100 MCG PO TABS: 100.0000 ug | ORAL_TABLET | Freq: Every day | ORAL | 0 refills | Status: DC

## 2023-07-16 NOTE — Telephone Encounter (Signed)
 Pt left a VM stating she is needing a new RX on her thyroid  medication to be sent to [facility].   Thank you

## 2023-07-16 NOTE — Telephone Encounter (Signed)
 Rx sent.

## 2023-09-04 ENCOUNTER — Other Ambulatory Visit: Payer: Self-pay | Admitting: "Endocrinology

## 2023-09-04 ENCOUNTER — Telehealth: Payer: Self-pay | Admitting: "Endocrinology

## 2023-09-04 DIAGNOSIS — Z85850 Personal history of malignant neoplasm of thyroid: Secondary | ICD-10-CM

## 2023-09-04 DIAGNOSIS — E890 Postprocedural hypothyroidism: Secondary | ICD-10-CM

## 2023-09-04 NOTE — Telephone Encounter (Signed)
 Pt did labs in December, does she need to redo labs and if so can you put an order in?

## 2023-09-04 NOTE — Telephone Encounter (Signed)
 Let pt know to go to [facility] to do labs

## 2023-09-15 ENCOUNTER — Encounter: Payer: Self-pay | Admitting: "Endocrinology

## 2023-09-15 ENCOUNTER — Ambulatory Visit: Payer: Medicare Other | Admitting: "Endocrinology

## 2023-09-15 VITALS — BP 132/72 | HR 92 | Ht 68.0 in | Wt 175.6 lb

## 2023-09-15 DIAGNOSIS — E559 Vitamin D deficiency, unspecified: Secondary | ICD-10-CM | POA: Insufficient documentation

## 2023-09-15 DIAGNOSIS — E890 Postprocedural hypothyroidism: Secondary | ICD-10-CM

## 2023-09-15 DIAGNOSIS — E782 Mixed hyperlipidemia: Secondary | ICD-10-CM | POA: Diagnosis not present

## 2023-09-15 DIAGNOSIS — Z85850 Personal history of malignant neoplasm of thyroid: Secondary | ICD-10-CM | POA: Diagnosis not present

## 2023-09-15 MED ORDER — LEVOTHYROXINE SODIUM 88 MCG PO TABS
88.0000 ug | ORAL_TABLET | Freq: Every day | ORAL | 1 refills | Status: DC
Start: 2023-09-15 — End: 2024-05-26

## 2023-09-15 NOTE — Progress Notes (Signed)
 09/15/2023             Endocrinology follow-up note   Subjective:    Patient ID: Joyce Martin, female    DOB: May 13, 1951, PCP Del Nigel Berthold, FNP   Past Medical History:  Diagnosis Date   Elevated BP 07/26/2014   GERD (gastroesophageal reflux disease)    Migraines    Positive test for human papillomavirus (HPV) 2014   on pap   Thyroid cancer (HCC) 11/2016   Past Surgical History:  Procedure Laterality Date   CATARACT EXTRACTION Bilateral 08/2022   COLONOSCOPY N/A 04/18/2016   Procedure: COLONOSCOPY;  Surgeon: West Bali, MD;  Location: [facility];  Service: Endoscopy;  Laterality: N/A;  9:30 AM   COLONOSCOPY WITH PROPOFOL N/A 06/29/2021   Procedure: COLONOSCOPY WITH PROPOFOL;  Surgeon: Lanelle Bal, DO;  Location: [facility];  Service: Endoscopy;  Laterality: N/A;  2:30 / ASA II   POLYPECTOMY  06/29/2021   Procedure: POLYPECTOMY;  Surgeon: Lanelle Bal, DO;  Location: [facility];  Service: Endoscopy;;   REFRACTIVE SURGERY Bilateral 03/15/2021   THYROIDECTOMY N/A 02/10/2017   Procedure: THYROIDECTOMY;  Surgeon: Franky Macho, MD;  Location: [facility];  Service: General;  Laterality: N/A;   TUBAL LIGATION     Social History   Socioeconomic History   Marital status: Divorced    Spouse name: Not on file   Number of children: Not on file   Years of education: Not on file   Highest education level: Not on file  Occupational History   Not on file  Tobacco Use   Smoking status: Former    Current packs/day: 0.00    Average packs/day: 0.3 packs/day for 5.0 years (1.3 ttl pk-yrs)    Types: Cigarettes    Start date: 01/29/1974    Quit date: 01/30/1979    Years since quitting: 44.6   Smokeless tobacco: Never  Vaping Use   Vaping status: Never Used  Substance and Sexual Activity   Alcohol use: No   Drug use: No   Sexual activity: Yes    Birth control/protection: Post-menopausal  Other Topics Concern   Not on file  Social History Narrative    Not on file   Social Drivers of Health   Financial Resource Strain: Low Risk  (01/24/2023)   Overall Financial Resource Strain (CARDIA)    Difficulty of Paying Living Expenses: Not hard at all  Food Insecurity: No Food Insecurity (01/24/2023)   Hunger Vital Sign    Worried About Running Out of Food in the Last Year: Never true    Ran Out of Food in the Last Year: Never true  Transportation Needs: No Transportation Needs (01/24/2023)   PRAPARE - Administrator, Civil Service (Medical): No    Lack of Transportation (Non-Medical): No  Physical Activity: Sufficiently Active (01/24/2023)   Exercise Vital Sign    Days of Exercise per Week: 5 days    Minutes of Exercise per Session: 60 min  Stress: Not on file  Social Connections: Moderately Isolated (01/24/2023)   Social Connection and Isolation Panel [NHANES]    Frequency of Communication with Friends and Family: More than three times a week    Frequency of Social Gatherings with Friends and Family: More than three times a week    Attends Religious Services: Never    Database administrator or Organizations: Yes    Attends Banker Meetings: Never    Marital Status: Divorced  Outpatient Encounter Medications as of 09/15/2023  Medication Sig   acetaminophen (TYLENOL) 650 MG CR tablet Take 650 mg by mouth every 8 (eight) hours as needed for pain.   amLODipine (NORVASC) 5 MG tablet Take 5 mg by mouth daily.   Cholecalciferol (VITAMIN D) 50 MCG (2000 UT) CAPS Take 1 capsule (2,000 Units total) by mouth daily with lunch.   cloNIDine (CATAPRES) 0.1 MG tablet Take 1 tablet (0.1 mg total) by mouth once as needed for up to 1 dose. Take one tablet if blood pressure above 170/100   levothyroxine (SYNTHROID) 88 MCG tablet Take 1 tablet (88 mcg total) by mouth daily before breakfast.   losartan (COZAAR) 25 MG tablet Take 1 tablet (25 mg total) by mouth daily.   Propylene Glycol (SYSTANE COMPLETE) 0.6 % SOLN Place 1 drop into both  eyes daily as needed (dry eyes).   rosuvastatin (CRESTOR) 5 MG tablet Take 1 tablet (5 mg total) by mouth daily.   [DISCONTINUED] levothyroxine (SYNTHROID) 100 MCG tablet Take 1 tablet (100 mcg total) by mouth daily before breakfast.   No facility-administered encounter medications on file as of 09/15/2023.   ALLERGIES: Allergies  Allergen Reactions   Aspirin Nausea Only   Other Other (See Comments)    COMBID: for stomach -twisting pt's face     VACCINATION STATUS: Immunization History  Administered Date(s) Administered   Fluad Trivalent(High Dose 65+) 04/28/2023   Influenza, High Dose Seasonal PF 03/10/2019   Tdap 02/28/2011    HPI  73 year old female patient with medical history as above.  She is being seen in follow-up for  postsurgical hypothyroidism, history of papillary thyroid cancer.   -Her history starts in 2018 when she was found to have abnormal fine-needle aspiration of thyroid nodule. She underwent total thyroidectomy on 02/10/2017 which revealed papillary thyroid cancer follicular variant. -Subsequently, she underwent  I-131 thyroid ablation with Thyrogen stimulation followed by whole body scan which revealed some thyroid remnant in the neck with no evidence of distant metastasis on 05/05/2017. January 2020 thyroid/neck ultrasound was unremarkable. -Her  Thyrogen stimulated whole-body scan on May 08, 2020 was consistent with no evidence of iodine avid distant metastasis.  She reports some tightening of her neck on the left side. - She is currently on levothyroxine 100 mcg p.o. daily before breakfast.  Her previsit labs are consistent with slight over replacement.    - She denies dysphagia, shortness of breath, voice change. - She has steady body weight over the last several months.  - She denies palpitations, heat/cold intolerance, and tremors.  Review of Systems  Limited as above. Objective:    BP 132/72   Pulse 92   Ht 5\' 8"  (1.727 m)   Wt 175 lb 9.6 oz  (79.7 kg)   BMI 26.70 kg/m   Wt Readings from Last 3 Encounters:  09/15/23 175 lb 9.6 oz (79.7 kg)  04/28/23 177 lb (80.3 kg)  12/26/22 173 lb (78.5 kg)    Physical Exam   1)  - Dedicated thyroid ultrasound showed 2.3 cm nodule on the left lobe and 2 nodules on the isthmus (0.8 cm and 0.7 cm.)  2)  Fine needle aspiration  Diagnosis on 01/09/2017 showed: THYROID, FINE NEEDLE ASPIRATION, LEFT (SPECIMEN 1 OF 1 COLLECTED 71/12/18) ATYPIA OF UNDETERMINED SIGNIFICANCE OR FOLLICULAR LESION OF UNDETERMINED SIGNIFICANCE (BETHESDA CATEGORY III).  3) Thyroid, thyroidectomy- 02/10/2017. 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 - TUMOR CONFINED WITHIN RENAL CAPSULE. - SEPARATE ADENOMATOUS NODULES. - BENIGN PARATHYROID GLAND.  4) post therapy for body scan from 05/05/2017 showed  Thyroid remnant.  No scintigraphic evidence of iodine-avid metastatic thyroid cancer.   Thyrogen stimulated whole-body scan on May 08, 2020. FINDINGS: No residual radioiodine uptake in the cervical region.   Excreted tracer within colon, urinary bladder, minimally in stomach.   No abnormal sites of radio iodine accumulation to suggest iodine-avid metastatic thyroid cancer.   IMPRESSION: No scintigraphic evidence of iodine-avid metastatic thyroid cancer.    Recent Results (from the past 2160 hours)  TSH     Status: None   Collection Time: 09/09/23 11:20 AM  Result Value Ref Range   TSH 0.650 0.450 - 4.500 uIU/mL  T4, free     Status: Abnormal   Collection Time: 09/09/23 11:20 AM  Result Value Ref Range   Free T4 1.78 (H) 0.82 - 1.77 ng/dL  Thyroglobulin antibody     Status: None   Collection Time: 09/09/23 11:20 AM  Result Value Ref Range   Thyroglobulin Antibody <1.0 0.0 - 0.9 IU/mL    Comment: Thyroglobulin Antibody measured by [employer] Methodology It should be noted that the presence of thyroglobulin antibodies may not be pathogenic nor diagnostic, especially at very  low levels. The assay manufacturer has found that four percent of individuals without evidence of thyroid disease or autoimmunity will have positive TgAb levels up to 4 IU/mL.   Thyroglobulin Level     Status: None (Preliminary result)   Collection Time: 09/09/23 11:20 AM  Result Value Ref Range   Thyroglobulin (TG-RIA) WILL FOLLOW     Assessment & Plan:   1. Follicular variant of Papillary Thyroid Cancer - Her 02/10/2017 sub total thyroidectomy revealed 0.4 cm unifocal , circumscribed, follicular variant of papillary thyroid cancer.TNM code: pT1a, pNx  -  Due to the fact that it is follicular variant and her relative youth, she was given adjuvant   thyroid remnant ablation utilizing I-131 with post therapy whole-body scan. This showed some thyroid remnant in the neck with no evidence of distant iodine avid metastasis.  -  Her stimulated thyroglobulin levels was detectable at 37. This warrants continuous surveillance for tumor recurrence. -Her most recent unstimulated thyroglobulin level was undetectable at less than 0.1. -She underwent surveillance thyroid/neck ultrasound in January 2020 which was negative for recurrent or residual tissue and no evidence of abnormal adenopathy.  -Her thyroglobulin level is less than 0.1, with undetectable antithyroglobulin antibodies. - Thyrogen stimulated whole-body scan -on November 2021 was negative for iodine avid distant metastasis.   -In light of her reporting tightening on the left side of her neck, physical exam negative, will be considered for previsit thyroid/neck ultrasound before her next visit.     2  Post thyroidectomy Hypothyroidism -Her recent thyroid function tests are consistent with slight over replacement.  I discussed and lowered her levothyroxine to 88 mcg p.o. daily before breakfast.     - We discussed about the correct intake of her thyroid hormone, on empty stomach at fasting, with water, separated by at least 30 minutes from  breakfast and other medications,  and separated by more than 4 hours from calcium, iron, multivitamins, acid reflux medications (PPIs). -Patient is made aware of the fact that thyroid hormone replacement is needed for life, dose to be adjusted by periodic monitoring of thyroid function tests.  3.  Hyperlipidemia:  She is responding favorably to rosuvastatin for hyperlipidemia. She is advised to continue Crestor 10 mg p.o. nightly.    She is also counseled on whole  food plant-based diet.    4.  Vitamin D deficiency: She is advised to continue vitamin D3 2000 units daily.     - I advised patient to maintain close follow up with Del Nigel Berthold, FNP for primary care needs.   I spent  26  minutes in the care of the patient today including review of labs from Thyroid Function, CMP, and other relevant labs ; imaging/biopsy records (current and previous including abstractions from other facilities); face-to-face time discussing  her lab results and symptoms, medications doses, her options of short and long term treatment based on the latest standards of care / guidelines;   and documenting the encounter.  Joyce Martin  participated in the discussions, expressed understanding, and voiced agreement with the above plans.  All questions were answered to her satisfaction. she is encouraged to contact clinic should she have any questions or concerns prior to her return visit.    Follow up plan: Return in about 6 months (around 03/17/2024), or Thyroid ultrasound anytime, for F/U with Pre-visit Labs, Thyroid / Neck Ultrasound.  Marquis Lunch, MD Phone: [phone]  Fax: [phone]  -  This note was partially dictated with voice recognition software. Similar sounding words can be transcribed inadequately or may not  be corrected upon review.  09/15/2023, 2:52 PM

## 2023-09-21 LAB — THYROGLOBULIN LEVEL: Thyroglobulin (TG-RIA): <2 ng/mL

## 2023-09-21 LAB — T4, FREE: Free T4: 1.78 ng/dL — ABNORMAL HIGH (ref 0.82–1.77)

## 2023-09-21 LAB — THYROGLOBULIN ANTIBODY: Thyroglobulin Antibody: <1 [IU]/mL (ref 0.0–0.9)

## 2023-09-21 LAB — TSH: TSH: 0.65 u[IU]/mL (ref 0.450–4.500)

## 2023-12-25 IMAGING — US US BREAST*L* LIMITED INC AXILLA
1 series · 3 of 3 positions shown · non-contrast
Comparison: Previous exams.

CLINICAL DATA: 70-year-old female with focal area of pain involving
the inner left breast. The patient does notice a this discomfort
with bending over as well in which she experiences a pulling
sensation.

EXAM:
DIGITAL DIAGNOSTIC UNILATERAL LEFT MAMMOGRAM WITH TOMOSYNTHESIS AND
CAD; ULTRASOUND LEFT BREAST LIMITED
TECHNIQUE: Left digital diagnostic mammography and breast tomosynthesis was
performed. The images were evaluated with computer-aided detection.;
Targeted ultrasound examination of the left breast was performed.

[Series 1: us breast*left* limited inc axilla · 0.07mm/px · 3 of 3 slices shown]
[im 1/3]
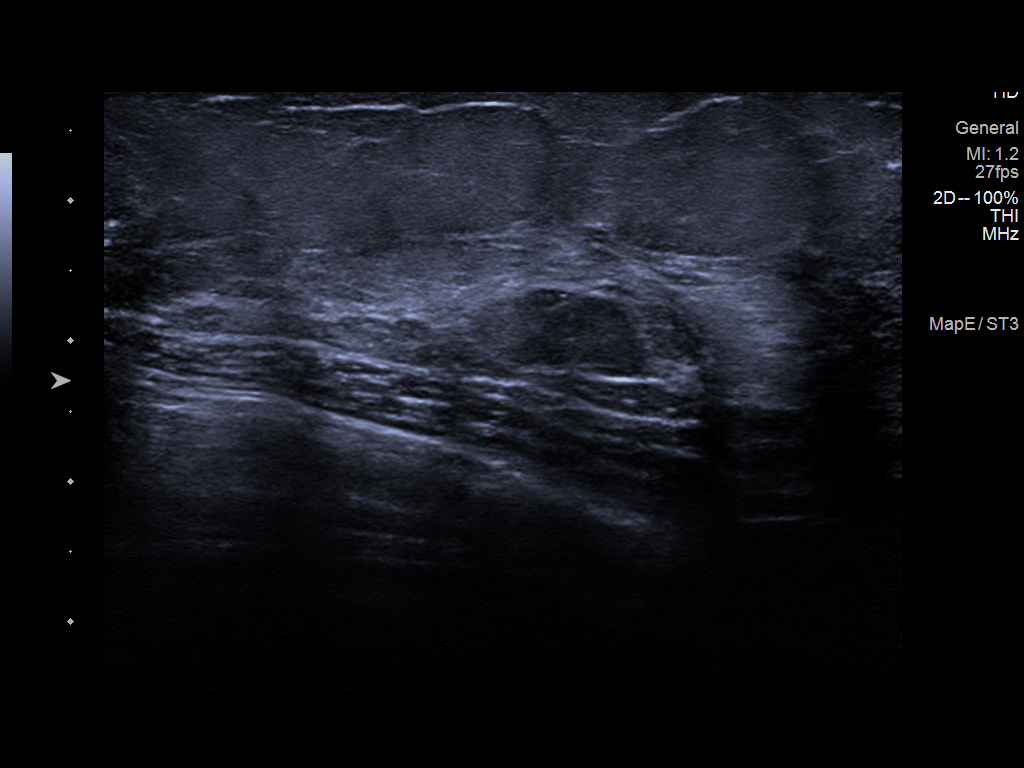
[im 2/3]
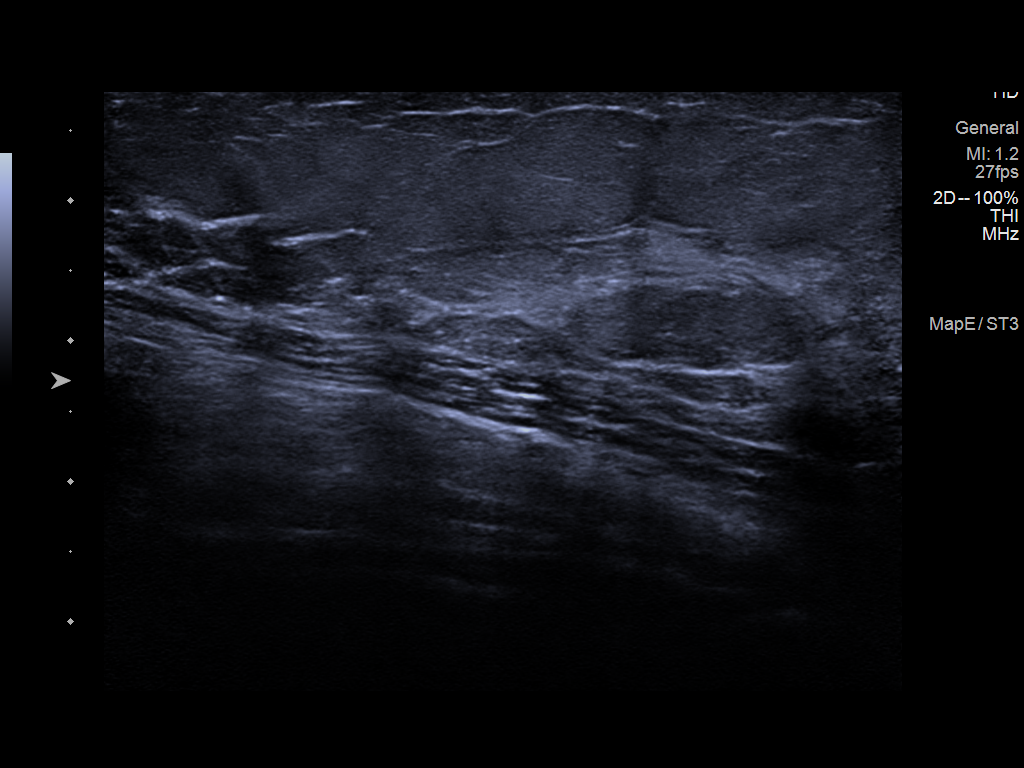
[im 3/3]
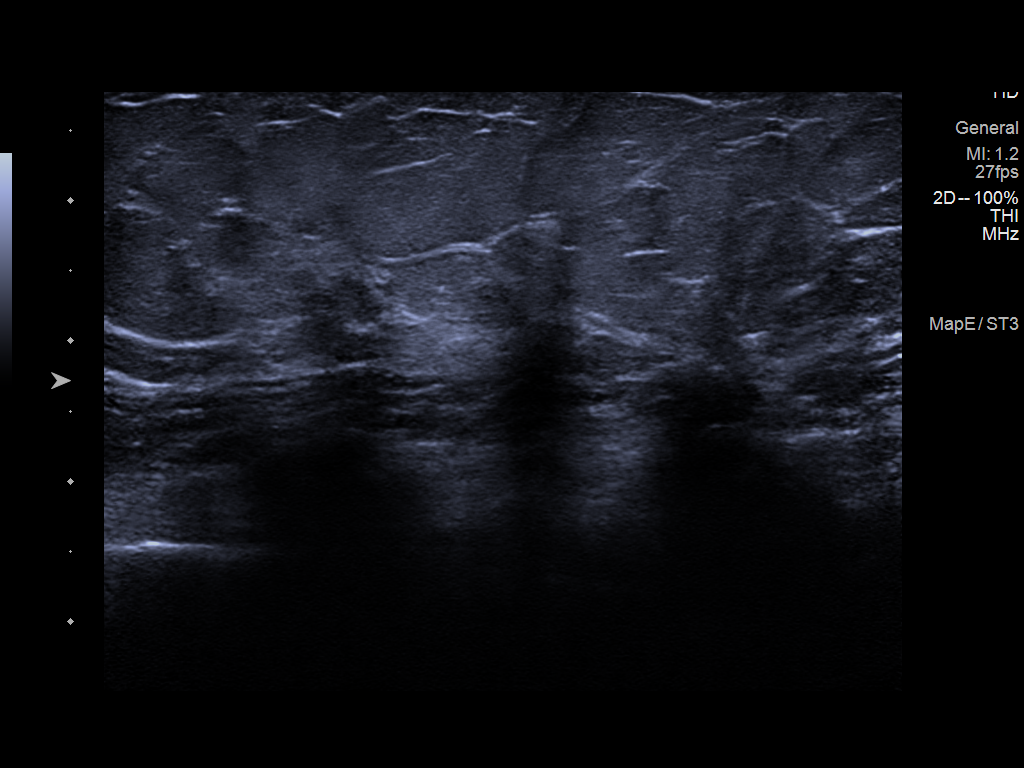

[3 of 3 positions shown; findings below may reference images not displayed]

ACR Breast Density Category c: The breast tissue is heterogeneously
dense, which may obscure small masses.
FINDINGS: No suspicious masses or calcifications are seen in the left breast.
Spot compression tomograms were performed over the palpable area of
concern in the left breast with no definite abnormality seen.

Physical examination the inner left breast in region of concern does
not discrete palpable masses.

Targeted ultrasound of the left breast was performed. No suspicious
masses or abnormality seen, only normal-appearing fibroglandular
tissue identified.
IMPRESSION: 1. No mammographic or sonographic abnormalities at site of concern
in the inner left breast.

Mammographic evidence of malignancy in the left breast.

RECOMMENDATION:
1. Recommend further management of the left breast tenderness be
based on clinical assessment.

2.  Recommend annual screening mammography, due July 2022.

I have discussed the findings and recommendations with the patient.
If applicable, a reminder letter will be sent to the patient
regarding the next appointment.

BI-RADS CATEGORY  1: Negative.

## 2023-12-25 IMAGING — MG MM DIGITAL DIAGNOSTIC UNILAT*L* W/ TOMO W/ CAD
6 series · 6 of 18 positions shown · non-contrast
Comparison: Previous exams.

CLINICAL DATA: 70-year-old female with focal area of pain involving
the inner left breast. The patient does notice a this discomfort
with bending over as well in which she experiences a pulling
sensation.

EXAM:
DIGITAL DIAGNOSTIC UNILATERAL LEFT MAMMOGRAM WITH TOMOSYNTHESIS AND
CAD; ULTRASOUND LEFT BREAST LIMITED
TECHNIQUE: Left digital diagnostic mammography and breast tomosynthesis was
performed. The images were evaluated with computer-aided detection.;
Targeted ultrasound examination of the left breast was performed.

[L CC synth-2D (1 of 2)]
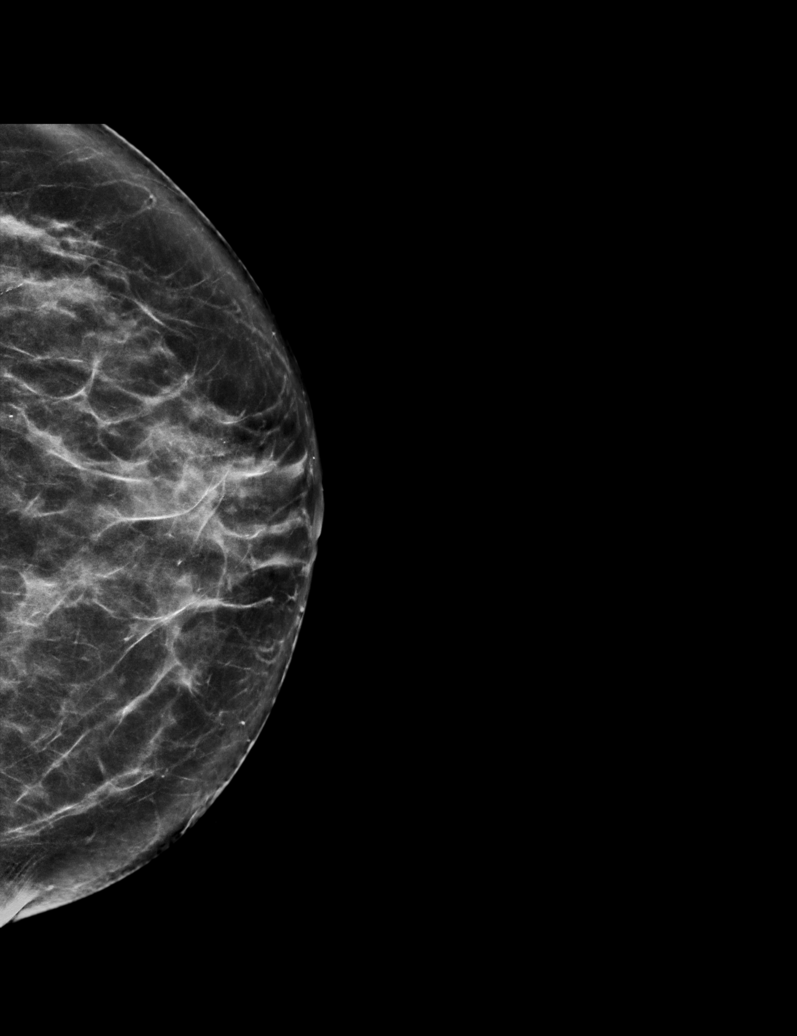

[L CC synth-2D (2 of 2)]
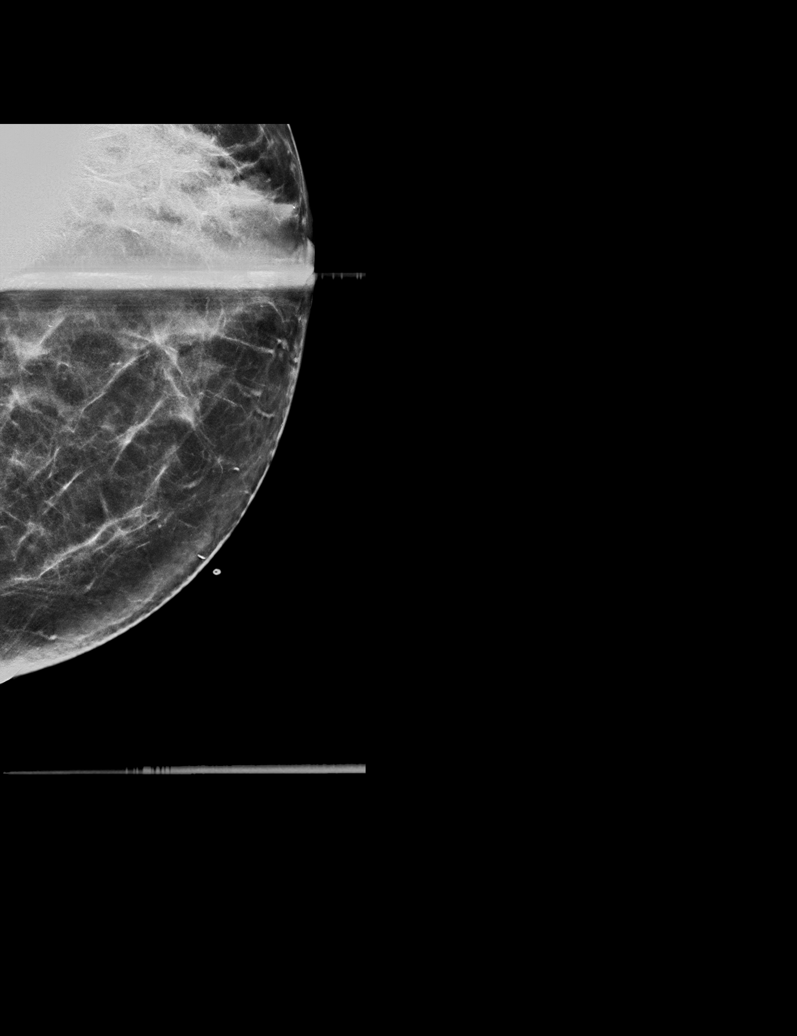

[L MLO synth-2D]
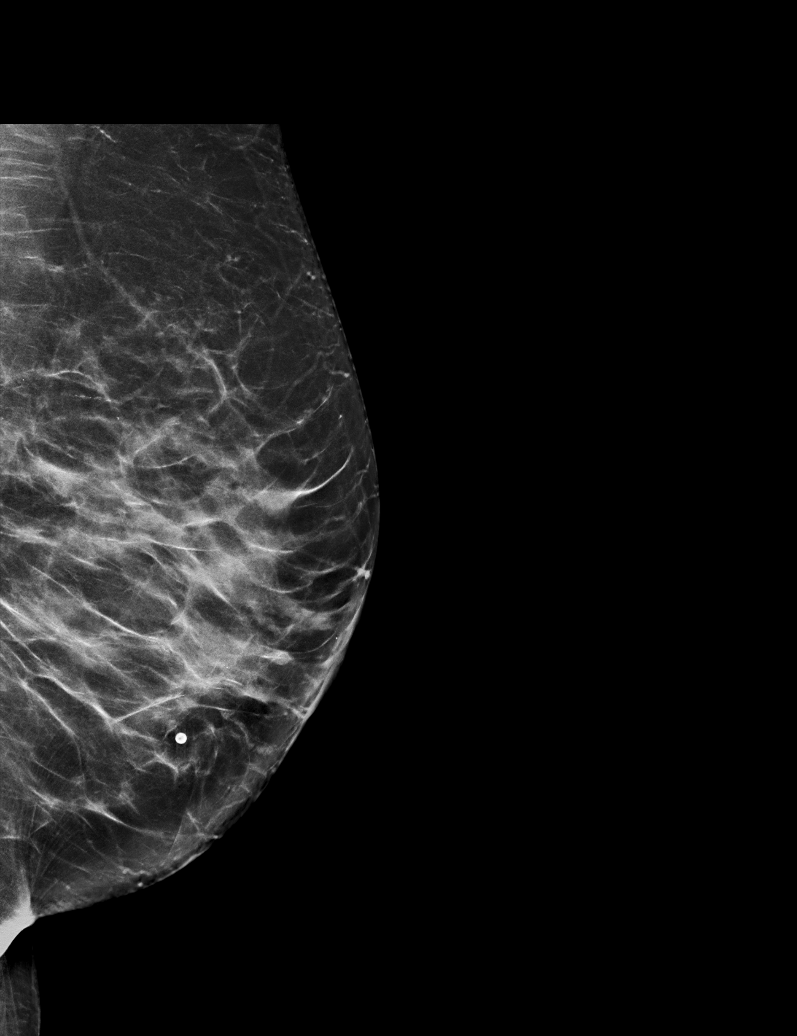

[L CC tomo (1 of 2) · tomo slice 35/70.0]
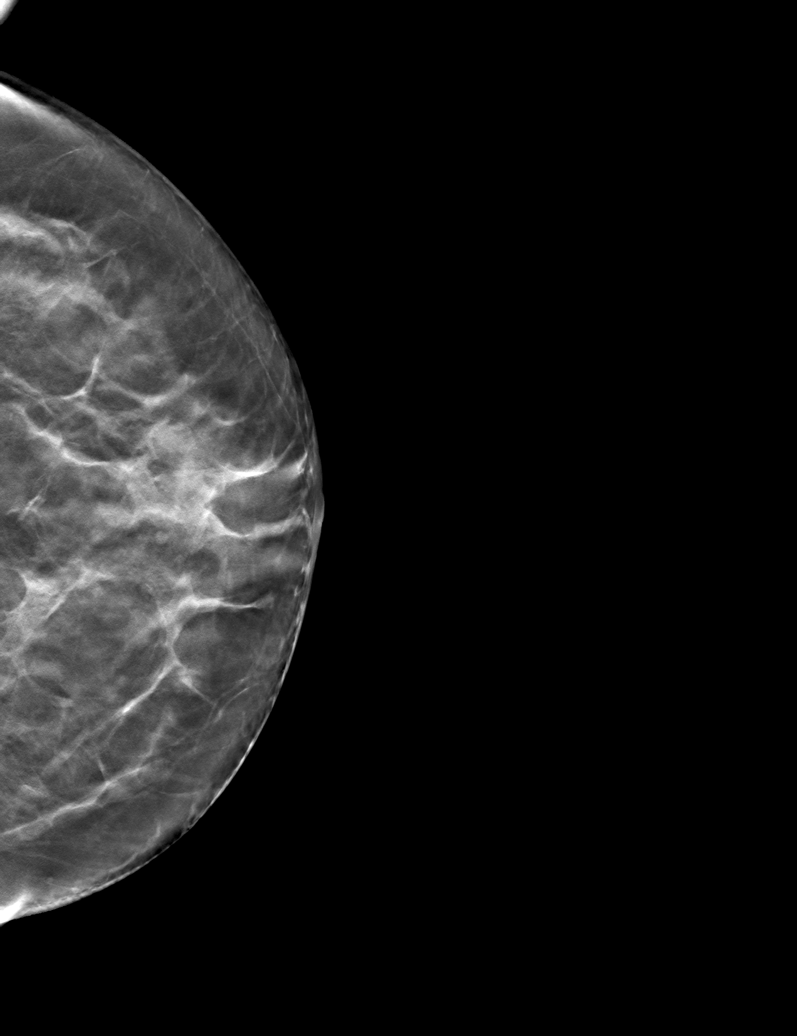

[L MLO tomo · tomo slice 37/72.0]
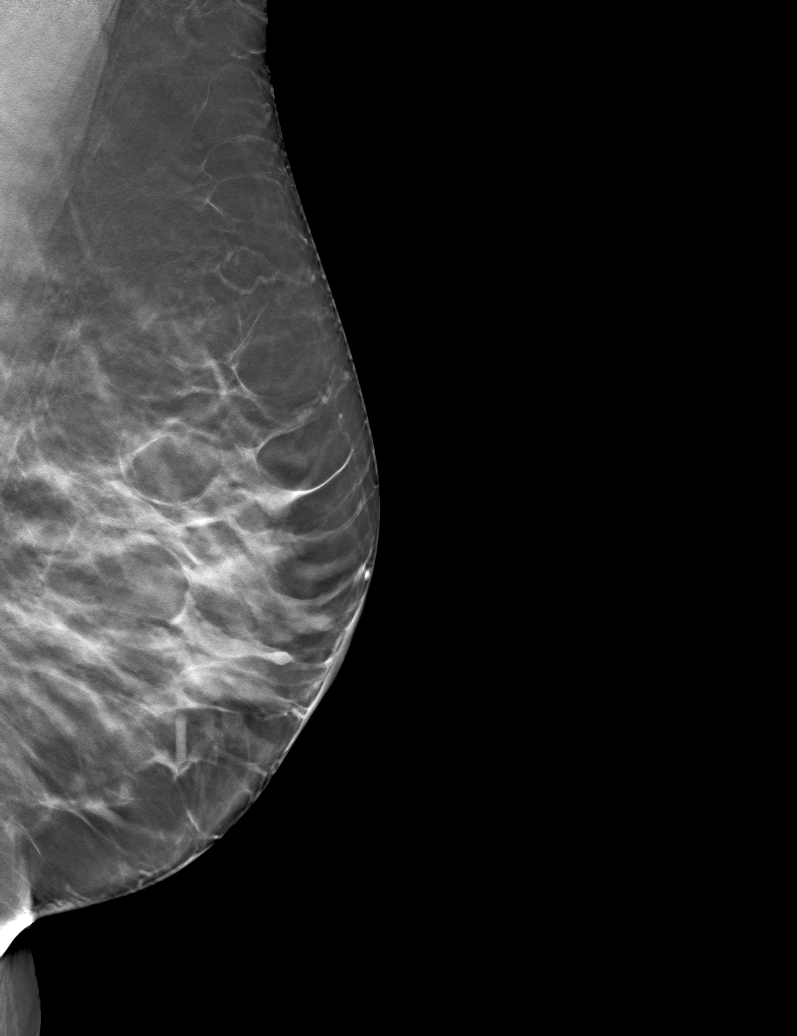

[L CC tomo (2 of 2) · tomo slice 29/58.0]
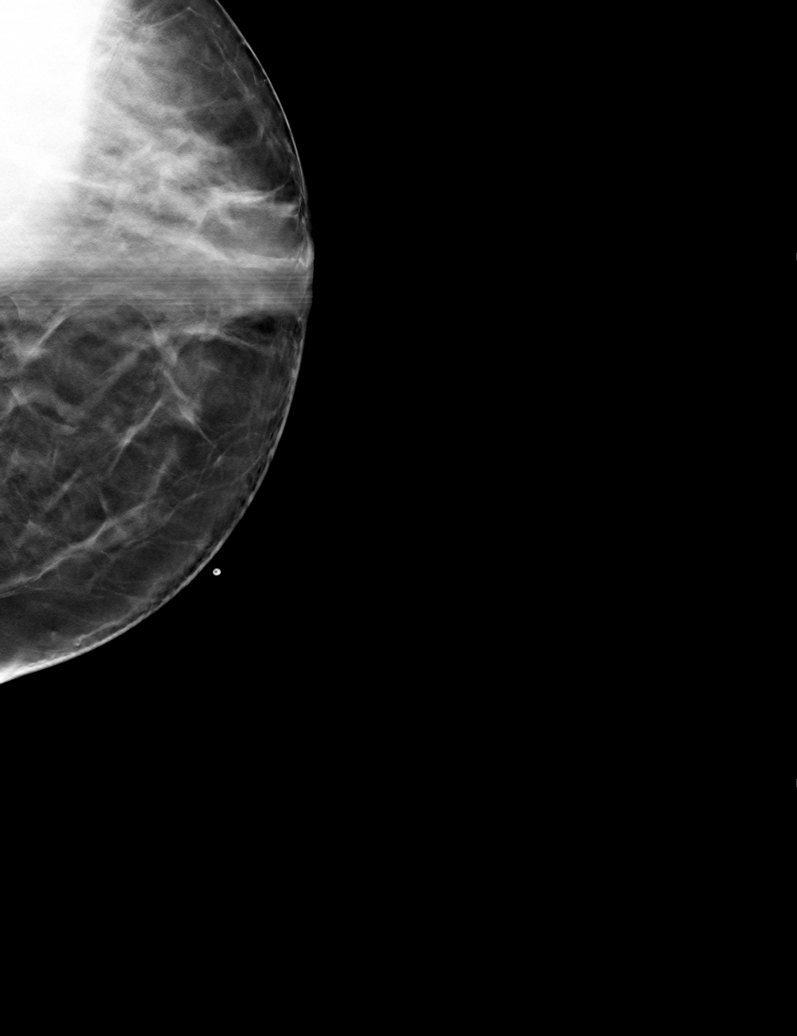

[6 of 18 positions shown; findings below may reference images not displayed]

ACR Breast Density Category c: The breast tissue is heterogeneously
dense, which may obscure small masses.
FINDINGS: No suspicious masses or calcifications are seen in the left breast.
Spot compression tomograms were performed over the palpable area of
concern in the left breast with no definite abnormality seen.

Physical examination the inner left breast in region of concern does
not discrete palpable masses.

Targeted ultrasound of the left breast was performed. No suspicious
masses or abnormality seen, only normal-appearing fibroglandular
tissue identified.
IMPRESSION: 1. No mammographic or sonographic abnormalities at site of concern
in the inner left breast.

Mammographic evidence of malignancy in the left breast.

RECOMMENDATION:
1. Recommend further management of the left breast tenderness be
based on clinical assessment.

2.  Recommend annual screening mammography, due July 2022.

I have discussed the findings and recommendations with the patient.
If applicable, a reminder letter will be sent to the patient
regarding the next appointment.

BI-RADS CATEGORY  1: Negative.

## 2024-01-09 ENCOUNTER — Telehealth: Payer: Self-pay | Admitting: Family Medicine

## 2024-01-09 ENCOUNTER — Other Ambulatory Visit: Payer: Self-pay | Admitting: Family Medicine

## 2024-01-09 MED ORDER — AMLODIPINE BESYLATE 5 MG PO TABS: 5.0000 mg | ORAL_TABLET | Freq: Every day | ORAL | 3 refills | Status: DC

## 2024-01-09 NOTE — Telephone Encounter (Signed)
 Patient came by office need refill has only 2 pills left.  Prescription Request  01/09/2024  LOV: 04/28/2023  What is the name of the medication or equipment? amLODipine  (NORVASC ) 5 MG tablet [[id number]]   Have you contacted your pharmacy to request a refill? Yes   Which pharmacy would you like this sent to?  [facility] [address], [place] - [address] [place] [address] Phone: [phone] Fax: [phone]    Patient notified that their request is being sent to the clinical staff for review and that they should receive a response within 2 business days.   Please advise at walked into office

## 2024-01-09 NOTE — Telephone Encounter (Signed)
 sent

## 2024-01-09 NOTE — Telephone Encounter (Signed)
 Pt advised with verbal understanding

## 2024-01-13 ENCOUNTER — Ambulatory Visit (HOSPITAL_COMMUNITY)
Admission: RE | Admit: 2024-01-13 | Discharge: 2024-01-13 | Disposition: A | Discharge status: Home / Self Care | Source: Ambulatory Visit | Attending: "Endocrinology | Admitting: "Endocrinology

## 2024-01-13 DIAGNOSIS — Z85850 Personal history of malignant neoplasm of thyroid: Secondary | ICD-10-CM | POA: Diagnosis present

## 2024-01-27 ENCOUNTER — Ambulatory Visit: Payer: Medicare Other

## 2024-01-27 ENCOUNTER — Telehealth: Payer: Self-pay | Admitting: Family Medicine

## 2024-01-27 NOTE — Telephone Encounter (Signed)
 Prescription Request  01/27/2024  LOV: 04/28/2023  What is the name of the medication or equipment? losartan  (COZAAR ) 25 MG tablet [[id number]]   Have you contacted your pharmacy to request a refill? Yes   Which pharmacy would you like this sent to?  [facility] [address] [place] - [address] [place] [address] Phone: [phone] Fax: [phone]    Patient notified that their request is being sent to the clinical staff for review and that they should receive a response within 2 business days.   Please advise at patient walked into office

## 2024-01-27 NOTE — Telephone Encounter (Signed)
 scheduled

## 2024-01-27 NOTE — Telephone Encounter (Signed)
 Patient scheduled for 08/07, patient only has 1 pill left for today only. Can a refill be sent for enough to get her through til office visit 08/07.patient call back # [phone]. she concern will she be okay without her medicine for a week and half?

## 2024-01-29 ENCOUNTER — Telehealth: Payer: Self-pay | Admitting: Family Medicine

## 2024-01-29 ENCOUNTER — Other Ambulatory Visit: Payer: Self-pay

## 2024-01-29 MED ORDER — LOSARTAN POTASSIUM 25 MG PO TABS: 25.0000 mg | ORAL_TABLET | Freq: Every day | ORAL | 0 refills | Status: DC

## 2024-01-29 NOTE — Telephone Encounter (Unsigned)
 Copied from CRM [id number]. Topic: Clinical - Medication Question >> Jan 29, 2024 12:29 PM Donee H wrote: Reason for CRM: Patient called to see what was the status on medication refill request for losartan  (COZAAR ) 25 MG tablet. Patient states she has taken her last pill Tuesday and now out of medication. She understands she has to see doctor. Patient is wanting to know if she can have just a order placed for just enough to last her until her upcoming appointment which is Aug. 7. Please follow up with patient on request [phone]

## 2024-01-29 NOTE — Telephone Encounter (Signed)
 30 days only called in until she can make it to her Aug 7 appt. Pt aware

## 2024-02-05 ENCOUNTER — Ambulatory Visit (INDEPENDENT_AMBULATORY_CARE_PROVIDER_SITE_OTHER): Admitting: Family Medicine

## 2024-02-05 ENCOUNTER — Encounter: Payer: Self-pay | Admitting: Family Medicine

## 2024-02-05 VITALS — BP 167/82 | HR 93 | Ht 68.0 in | Wt 174.0 lb

## 2024-02-05 DIAGNOSIS — I10 Essential (primary) hypertension: Secondary | ICD-10-CM | POA: Diagnosis not present

## 2024-02-05 MED ORDER — LOSARTAN POTASSIUM 25 MG PO TABS: 25.0000 mg | ORAL_TABLET | Freq: Every day | ORAL | 1 refills | Status: DC

## 2024-02-05 NOTE — Assessment & Plan Note (Signed)
 The patient's blood pressure is uncontrolled today in the clinic, although she remains asymptomatic. She reports that the recent death of her brother may be contributing to her elevated blood pressure. At home, her readings are typically around 130s/70s.  She is requesting a refill of Losartan  25 mg, which has been sent to her pharmacy. She was encouraged to increase the dose to 50 mg daily (by taking two 25 mg tablets once daily) if her blood pressure remains consistently above 140/90 mmHg.  The patient was also encouraged to follow a low-sodium diet and increase physical activity to support blood pressure control.  BP Readings from Last 3 Encounters:  02/05/24 (!) 167/82  09/15/23 132/72  04/28/23 135/82

## 2024-02-05 NOTE — Patient Instructions (Addendum)
 I appreciate the opportunity to provide care to you today!  Hypertension Management  Your current blood pressure is above the target goal of <140/90 mmHg. To address this, please continue taking Amlodipine  5 mg once daily and Losartan  25 mg once daily. If your blood pressure remains consistently above 140/90, you may increase to Losartan  50 mg once daily (taken as two 25 mg tablets).   Medication Instructions: Take your blood pressure medication at the same time each day. After taking your medication, check your blood pressure at least an hour later. If your first reading is >140/90 mmHg, wait at least 10 minutes and recheck your blood pressure. Side Effects: In the initial days of therapy, you may experience dizziness or lightheadedness as your body adjusts to the lower blood pressure; this is expected. Diet and Lifestyle: Adhere to a low-sodium diet, limiting intake to less than 1500 mg daily, and increase your physical activity. Avoid over-the-counter NSAIDs such as ibuprofen and naproxen while on this medication. Hydration and Nutrition: Stay well-hydrated by drinking at least 64 ounces of water  daily. Increase your servings of fruits and vegetables and avoid excessive sodium in your diet. Long-Term Considerations: Uncontrolled hypertension can increase the risk of cardiovascular diseases, including stroke, coronary artery disease, and heart failure.  Please report to the emergency department if your blood pressure exceeds 180/120 and is accompanied by symptoms such as headaches, chest pain, palpitations, blurred vision, or dizziness.    Please follow up if your symptoms worsen or fail to improve.    Attached with your AVS, you will find valuable resources for self-education. I highly recommend dedicating some time to thoroughly examine them.   Please continue to a heart-healthy diet and increase your physical activities. Try to exercise for at least five days a week.    It was a  pleasure to see you and I look forward to continuing to work together on your health and well-being. Please do not hesitate to call the office if you need care or have questions about your care.  In case of emergency, please visit the Emergency Department for urgent care, or contact our clinic at [phone] to schedule an appointment. We're here to help you!   Have a wonderful day and week. With Gratitude, Yassen Kinnett MSN, FNP-BC

## 2024-02-05 NOTE — Progress Notes (Signed)
 Established Patient Office Visit  Subjective:  Patient ID: Joyce Martin, female    DOB: 1951-06-07  Age: 73 y.o. MRN: [id number]  CC:  Chief Complaint  Patient presents with   Medical Management of Chronic Issues    Med refill and check up     HPI Joyce Martin is a 73 y.o. female with past medical history of hypertension, thyroid  nodules presents for f/u of  chronic medical conditions.  For the details of today's visit, please refer to the assessment and plan.     Past Medical History:  Diagnosis Date   Elevated BP 07/26/2014   GERD (gastroesophageal reflux disease)    Migraines    Positive test for human papillomavirus (HPV) 2014   on pap   Thyroid  cancer (HCC) 11/2016    Past Surgical History:  Procedure Laterality Date   CATARACT EXTRACTION Bilateral 08/2022   COLONOSCOPY N/A 04/18/2016   Procedure: COLONOSCOPY;  Surgeon: Margo LITTIE Haddock, MD;  [facility];  Service: Endoscopy;  Laterality: N/A;  9:30 AM   COLONOSCOPY WITH PROPOFOL  N/A 06/29/2021   Procedure: COLONOSCOPY WITH PROPOFOL ;  Surgeon: Cindie Carlin POUR, DO;  Location: [facility];  Service: Endoscopy;  Laterality: N/A;  2:30 / ASA II   POLYPECTOMY  06/29/2021   Procedure: POLYPECTOMY;  Surgeon: Cindie Carlin POUR, DO;  Location: [facility];  Service: Endoscopy;;   REFRACTIVE SURGERY Bilateral 03/15/2021   THYROIDECTOMY N/A 02/10/2017   Procedure: THYROIDECTOMY;  Surgeon: Mavis Anes, MD;  Location: [facility];  Service: General;  Laterality: N/A;   TUBAL LIGATION      Family History  Problem Relation Age of Onset   Diabetes Mother    Hypertension Mother    Heart disease Father        heart attack   Cancer Sister        gallbladder   Diabetes Brother    Diabetes Daughter        borderline   Diabetes Maternal Grandmother    Diabetes Paternal Grandmother    Diabetes Paternal Grandfather    COPD Brother    Hypertension Brother    Parkinson's disease Brother    Cancer Brother     Colon cancer Neg Hx     Social History   Socioeconomic History   Marital status: Divorced    Spouse name: Not on file   Number of children: Not on file   Years of education: Not on file   Highest education level: Not on file  Occupational History   Not on file  Tobacco Use   Smoking status: Former    Current packs/day: 0.00    Average packs/day: 0.3 packs/day for 5.0 years (1.3 ttl pk-yrs)    Types: Cigarettes    Start date: 01/29/1974    Quit date: 01/30/1979    Years since quitting: 45.0   Smokeless tobacco: Never  Vaping Use   Vaping status: Never Used  Substance and Sexual Activity   Alcohol use: No   Drug use: No   Sexual activity: Yes    Birth control/protection: Post-menopausal  Other Topics Concern   Not on file  Social History Narrative   Not on file   Social Drivers of Health   Financial Resource Strain: Low Risk  (01/24/2023)   Overall Financial Resource Strain (CARDIA)    Difficulty of Paying Living Expenses: Not hard at all  Food Insecurity: No Food Insecurity (01/24/2023)   Hunger Vital Sign    Worried  About Running Out of Food in the Last Year: Never true    Ran Out of Food in the Last Year: Never true  Transportation Needs: No Transportation Needs (01/24/2023)   PRAPARE - Administrator, Civil Service (Medical): No    Lack of Transportation (Non-Medical): No  Physical Activity: Sufficiently Active (01/24/2023)   Exercise Vital Sign    Days of Exercise per Week: 5 days    Minutes of Exercise per Session: 60 min  Stress: Not on file  Social Connections: Moderately Isolated (01/24/2023)   Social Connection and Isolation Panel    Frequency of Communication with Friends and Family: More than three times a week    Frequency of Social Gatherings with Friends and Family: More than three times a week    Attends Religious Services: Never    Database administrator or Organizations: Yes    Attends Banker Meetings: Never    Marital  Status: Divorced  Catering manager Violence: Not on file    Outpatient Medications Prior to Visit  Medication Sig Dispense Refill   acetaminophen  (TYLENOL ) 650 MG CR tablet Take 650 mg by mouth every 8 (eight) hours as needed for pain.     amLODipine  (NORVASC ) 5 MG tablet Take 1 tablet (5 mg total) by mouth daily. 30 tablet 3   Cholecalciferol (VITAMIN D ) 50 MCG (2000 UT) CAPS Take 1 capsule (2,000 Units total) by mouth daily with lunch. 90 capsule 3   cloNIDine  (CATAPRES ) 0.1 MG tablet Take 1 tablet (0.1 mg total) by mouth once as needed for up to 1 dose. Take one tablet if blood pressure above 170/100 90 tablet 1   levothyroxine  (SYNTHROID ) 88 MCG tablet Take 1 tablet (88 mcg total) by mouth daily before breakfast. 90 tablet 1   Propylene Glycol (SYSTANE COMPLETE) 0.6 % SOLN Place 1 drop into both eyes daily as needed (dry eyes).     rosuvastatin  (CRESTOR ) 5 MG tablet Take 1 tablet (5 mg total) by mouth daily. 90 tablet 3   losartan  (COZAAR ) 25 MG tablet Take 1 tablet (25 mg total) by mouth daily. 30 tablet 0   No facility-administered medications prior to visit.    Allergies  Allergen Reactions   Aspirin Nausea Only   Other Other (See Comments)    COMBID: for stomach -twisting pt's face     ROS Review of Systems  Constitutional:  Negative for chills and fever.  Eyes:  Negative for visual disturbance.  Respiratory:  Negative for chest tightness and shortness of breath.   Neurological:  Negative for dizziness and headaches.      Objective:    Physical Exam HENT:     Head: Normocephalic.     Mouth/Throat:     Mouth: Mucous membranes are moist.  Cardiovascular:     Rate and Rhythm: Normal rate.     Heart sounds: Normal heart sounds.  Pulmonary:     Effort: Pulmonary effort is normal.     Breath sounds: Normal breath sounds.  Neurological:     Mental Status: She is alert.     BP (!) 167/82   Pulse 93   Ht 5' 8 (1.727 m)   Wt 174 lb (78.9 kg)   SpO2 98%   BMI  26.46 kg/m  Wt Readings from Last 3 Encounters:  02/05/24 174 lb (78.9 kg)  09/15/23 175 lb 9.6 oz (79.7 kg)  04/28/23 177 lb (80.3 kg)    Lab Results  Component Value Date  TSH 0.650 09/09/2023   Lab Results  Component Value Date   WBC 3.5 06/13/2023   HGB 11.2 06/13/2023   HCT 33.9 (L) 06/13/2023   MCV 89 06/13/2023   PLT 245 06/13/2023   Lab Results  Component Value Date   NA 143 06/13/2023   K 4.8 06/13/2023   CO2 23 06/13/2023   GLUCOSE 88 06/13/2023   BUN 16 06/13/2023   CREATININE 1.17 (H) 06/13/2023   BILITOT 0.6 12/17/2022   ALKPHOS 70 12/17/2022   AST 17 12/17/2022   ALT 11 12/17/2022   PROT 7.0 12/17/2022   ALBUMIN 4.3 12/17/2022   CALCIUM  9.1 06/13/2023   ANIONGAP 8 02/11/2017   EGFR 49 (L) 06/13/2023   Lab Results  Component Value Date   CHOL 159 06/13/2023   Lab Results  Component Value Date   HDL 73 06/13/2023   Lab Results  Component Value Date   LDLCALC 75 06/13/2023   Lab Results  Component Value Date   TRIG 55 06/13/2023   Lab Results  Component Value Date   CHOLHDL 2.2 06/13/2023   Lab Results  Component Value Date   HGBA1C 5.8 12/19/2022      Assessment & Plan:  Primary hypertension Assessment & Plan: The patient's blood pressure is uncontrolled today in the clinic, although she remains asymptomatic. She reports that the recent death of her brother may be contributing to her elevated blood pressure. At home, her readings are typically around 130s/70s.  She is requesting a refill of Losartan  25 mg, which has been sent to her pharmacy. She was encouraged to increase the dose to 50 mg daily (by taking two 25 mg tablets once daily) if her blood pressure remains consistently above 140/90 mmHg.  The patient was also encouraged to follow a low-sodium diet and increase physical activity to support blood pressure control.  BP Readings from Last 3 Encounters:  02/05/24 (!) 167/82  09/15/23 132/72  04/28/23 135/82       Orders: -     Losartan  Potassium; Take 1 tablet (25 mg total) by mouth daily.  Dispense: 90 tablet; Refill: 1  Note: This chart has been completed using Engineer, civil (consulting) software, and while attempts have been made to ensure accuracy, certain words and phrases may not be transcribed as intended.    Follow-up: No follow-ups on file.   Kerrigan Glendening, FNP

## 2024-02-18 ENCOUNTER — Ambulatory Visit

## 2024-03-10 ENCOUNTER — Ambulatory Visit: Payer: Medicare Other

## 2024-03-17 ENCOUNTER — Ambulatory Visit: Admitting: "Endocrinology

## 2024-03-17 ENCOUNTER — Encounter: Payer: Self-pay | Admitting: "Endocrinology

## 2024-03-17 VITALS — BP 126/80 | HR 88 | Ht 68.0 in | Wt 173.4 lb

## 2024-03-17 DIAGNOSIS — Z85850 Personal history of malignant neoplasm of thyroid: Secondary | ICD-10-CM

## 2024-03-17 DIAGNOSIS — E890 Postprocedural hypothyroidism: Secondary | ICD-10-CM | POA: Diagnosis not present

## 2024-03-17 DIAGNOSIS — E559 Vitamin D deficiency, unspecified: Secondary | ICD-10-CM

## 2024-03-17 DIAGNOSIS — E782 Mixed hyperlipidemia: Secondary | ICD-10-CM

## 2024-03-17 NOTE — Progress Notes (Signed)
 03/17/2024             Endocrinology follow-up note   Subjective:    Patient ID: Joyce Martin, female    DOB: 05-23-1951, PCP Del Wilhelmena Lloyd Sola, FNP   Past Medical History:  Diagnosis Date   Elevated BP 07/26/2014   GERD (gastroesophageal reflux disease)    Migraines    Positive test for human papillomavirus (HPV) 2014   on pap   Thyroid  cancer (HCC) 11/2016   Past Surgical History:  Procedure Laterality Date   CATARACT EXTRACTION Bilateral 08/2022   COLONOSCOPY N/A 04/18/2016   Procedure: COLONOSCOPY;  Surgeon: Margo LITTIE Haddock, MD;  [facility];  Service: Endoscopy;  Laterality: N/A;  9:30 AM   COLONOSCOPY WITH PROPOFOL  N/A 06/29/2021   Procedure: COLONOSCOPY WITH PROPOFOL ;  Surgeon: Cindie Carlin POUR, DO;  [facility];  Service: Endoscopy;  Laterality: N/A;  2:30 / ASA II   POLYPECTOMY  06/29/2021   Procedure: POLYPECTOMY;  Surgeon: Cindie Carlin POUR, DO;  [facility];  Service: Endoscopy;;   REFRACTIVE SURGERY Bilateral 03/15/2021   THYROIDECTOMY N/A 02/10/2017   Procedure: THYROIDECTOMY;  Surgeon: Mavis Anes, MD;  Location: [facility];  Service: General;  Laterality: N/A;   TUBAL LIGATION     Social History   Socioeconomic History   Marital status: Divorced    Spouse name: Not on file   Number of children: Not on file   Years of education: Not on file   Highest education level: Not on file  Occupational History   Not on file  Tobacco Use   Smoking status: Former    Current packs/day: 0.00    Average packs/day: 0.3 packs/day for 5.0 years (1.3 ttl pk-yrs)    Types: Cigarettes    Start date: 01/29/1974    Quit date: 01/30/1979    Years since quitting: 45.1   Smokeless tobacco: Never  Vaping Use   Vaping status: Never Used  Substance and Sexual Activity   Alcohol use: No   Drug use: No   Sexual activity: Yes    Birth control/protection: Post-menopausal  Other Topics Concern   Not on file  Social History Narrative    Not on file   Social Drivers of Health   Financial Resource Strain: Low Risk  (01/24/2023)   Overall Financial Resource Strain (CARDIA)    Difficulty of Paying Living Expenses: Not hard at all  Food Insecurity: No Food Insecurity (01/24/2023)   Hunger Vital Sign    Worried About Running Out of Food in the Last Year: Never true    Ran Out of Food in the Last Year: Never true  Transportation Needs: No Transportation Needs (01/24/2023)   PRAPARE - Administrator, Civil Service (Medical): No    Lack of Transportation (Non-Medical): No  Physical Activity: Sufficiently Active (01/24/2023)   Exercise Vital Sign    Days of Exercise per Week: 5 days    Minutes of Exercise per Session: 60 min  Stress: Not on file  Social Connections: Moderately Isolated (01/24/2023)   Social Connection and Isolation Panel    Frequency of Communication with Friends and Family: More than three times a week    Frequency of Social Gatherings with Friends and Family: More than three times a week    Attends Religious Services: Never    Database administrator or Organizations: Yes    Attends Banker Meetings: Never    Marital Status: Divorced  Outpatient Encounter Medications as of 03/17/2024  Medication Sig   acetaminophen  (TYLENOL ) 650 MG CR tablet Take 650 mg by mouth every 8 (eight) hours as needed for pain.   amLODipine  (NORVASC ) 5 MG tablet Take 1 tablet (5 mg total) by mouth daily.   Cholecalciferol (VITAMIN D ) 50 MCG (2000 UT) CAPS Take 1 capsule (2,000 Units total) by mouth daily with lunch.   cloNIDine  (CATAPRES ) 0.1 MG tablet Take 1 tablet (0.1 mg total) by mouth once as needed for up to 1 dose. Take one tablet if blood pressure above 170/100   levothyroxine  (SYNTHROID ) 88 MCG tablet Take 1 tablet (88 mcg total) by mouth daily before breakfast.   losartan  (COZAAR ) 25 MG tablet Take 1 tablet (25 mg total) by mouth daily.   Propylene Glycol (SYSTANE COMPLETE) 0.6 % SOLN Place 1 drop  into both eyes daily as needed (dry eyes).   rosuvastatin  (CRESTOR ) 5 MG tablet Take 1 tablet (5 mg total) by mouth daily.   No facility-administered encounter medications on file as of 03/17/2024.   ALLERGIES: Allergies  Allergen Reactions   Aspirin Nausea Only   Other Other (See Comments)    COMBID: for stomach -twisting pt's face     VACCINATION STATUS: Immunization History  Administered Date(s) Administered   Fluad Trivalent(High Dose 65+) 04/28/2023   INFLUENZA, HIGH DOSE SEASONAL PF 03/10/2019   Tdap 02/28/2011    HPI  73 year old female patient with medical history as above.  She is being seen in follow-up for  postsurgical hypothyroidism, history of papillary thyroid  cancer.   -Her history starts in 2018 when she was found to have abnormal fine-needle aspiration of thyroid  nodule. She underwent total thyroidectomy on 02/10/2017 which revealed papillary thyroid  cancer follicular variant. -Subsequently, she underwent  I-131 thyroid  ablation with Thyrogen  stimulation followed by whole body scan which revealed some thyroid  remnant in the neck with no evidence of distant metastasis on 05/05/2017. January 2020 thyroid /neck ultrasound was unremarkable. -Her  Thyrogen  stimulated whole-body scan on May 08, 2020 was consistent with no evidence of iodine avid distant metastasis.  Her most recent surveillance imaging from January 13, 2024 showed negative thyroid  bed, however tiny benign-appearing left cervical lymph node.  She denies dysphagia, dysphonia nor shortness of breath.  She continues to have some tightening of her neck on the left side. - She is currently on levothyroxine  88 mcg p.o. daily before breakfast.  Her previsit labs are consistent with appropriate replacement.    - She has steady body weight over the last several months.  - She denies palpitations, heat/cold intolerance, and tremors.  Review of Systems  Limited as above. Objective:    BP 126/80   Pulse 88    Ht 5' 8 (1.727 m)   Wt 173 lb 6.4 oz (78.7 kg)   BMI 26.37 kg/m   Wt Readings from Last 3 Encounters:  03/17/24 173 lb 6.4 oz (78.7 kg)  02/05/24 174 lb (78.9 kg)  09/15/23 175 lb 9.6 oz (79.7 kg)    Physical Exam   1)  - Dedicated thyroid  ultrasound showed 2.3 cm nodule on the left lobe and 2 nodules on the isthmus (0.8 cm and 0.7 cm.)  2)  Fine needle aspiration  Diagnosis on 01/09/2017 showed: THYROID , FINE NEEDLE ASPIRATION, LEFT (SPECIMEN 1 OF 1 COLLECTED 71/12/18) ATYPIA OF UNDETERMINED SIGNIFICANCE OR FOLLICULAR LESION OF UNDETERMINED SIGNIFICANCE (BETHESDA CATEGORY III).  3) Thyroid , thyroidectomy- 02/10/2017. 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 - TUMOR CONFINED WITHIN RENAL CAPSULE. -  SEPARATE ADENOMATOUS NODULES. - BENIGN PARATHYROID GLAND. 4) post therapy for body scan from 05/05/2017 showed  Thyroid  remnant.  No scintigraphic evidence of iodine-avid metastatic thyroid  cancer.   Thyrogen  stimulated whole-body scan on May 08, 2020. FINDINGS: No residual radioiodine uptake in the cervical region.   Excreted tracer within colon, urinary bladder, minimally in stomach.   No abnormal sites of radio iodine accumulation to suggest iodine-avid metastatic thyroid  cancer.   IMPRESSION: No scintigraphic evidence of iodine-avid metastatic thyroid  cancer.    Recent Results (from the past 2160 hours)  TSH     Status: None   Collection Time: 03/10/24 11:20 AM  Result Value Ref Range   TSH 2.000 0.450 - 4.500 uIU/mL  T4, free     Status: None   Collection Time: 03/10/24 11:20 AM  Result Value Ref Range   Free T4 1.51 0.82 - 1.77 ng/dL  Thyroglobulin antibody     Status: None   Collection Time: 03/10/24 11:20 AM  Result Value Ref Range   Thyroglobulin Antibody <1.0 0.0 - 0.9 IU/mL    Comment: Thyroglobulin Antibody measured by [employer] Methodology It should be noted that the presence of thyroglobulin antibodies may not be pathogenic  nor diagnostic, especially at very low levels. The assay manufacturer has found that four percent of individuals without evidence of thyroid  disease or autoimmunity will have positive TgAb levels up to 4 IU/mL.   Thyroglobulin Level     Status: None (Preliminary result)   Collection Time: 03/10/24 11:20 AM  Result Value Ref Range   Thyroglobulin (TG-RIA) WILL FOLLOW    January 13, 2024 thyroid /neck ultrasound IMPRESSION: 1. Status post thyroidectomy. Previously identified nodule in the right thyroidectomy bed is no longer seen on the study.   2. Tiny benign-appearing left cervical lymph node. Recommend attention on follow-up.  Assessment & Plan:   1. Follicular variant of Papillary Thyroid  Cancer - Her 02/10/2017 sub total thyroidectomy revealed 0.4 cm unifocal , circumscribed, follicular variant of papillary thyroid  cancer.TNM code: pT1a, pNx  -  Due to the fact that it is follicular variant and her relative youth, she was given adjuvant   thyroid  remnant ablation utilizing I-131 with post therapy whole-body scan. This showed some thyroid  remnant in the neck with no evidence of distant iodine avid metastasis.  -  Her stimulated thyroglobulin levels was detectable at 37. This warrants continuous surveillance for tumor recurrence. -Her most recent unstimulated thyroglobulin level was undetectable at less than 0.1. -She underwent surveillance thyroid /neck ultrasound in January 2020 which was negative for recurrent or residual tissue and no evidence of abnormal adenopathy.  -Her thyroglobulin level is less than 0.1, with undetectable antithyroglobulin antibodies. - Thyrogen  stimulated whole-body scan -on November 2021 was negative for iodine avid distant metastasis.   -In light of her reporting tightening on the left side of her neck, physical exam negative, she was offered surveillance thyroid /neck ultrasound which showed negative thyroid  bed findings tiny benign-appearing lymph node on the  left.    She will not need immediate intervention, however may need repeat ultrasound in a year.   2  Post thyroidectomy Hypothyroidism -Her recent thyroid  function tests are consistent with appropriate replacement.  She is advised to continue levothyroxine  88 mcg p.o. daily before breakfast.     - We discussed about the correct intake of her thyroid  hormone, on empty stomach at fasting, with water , separated by at least 30 minutes from breakfast and other medications,  and separated by more than 4 hours from  calcium , iron, multivitamins, acid reflux medications (PPIs). -Patient is made aware of the fact that thyroid  hormone replacement is needed for life, dose to be adjusted by periodic monitoring of thyroid  function tests.   3.  Hyperlipidemia:  She is responding favorably to rosuvastatin  for hyperlipidemia. She is advised to Crestor  10 mg p.o. nightly.  Side effects and precautions discussed with her.  4.  Vitamin D  deficiency: She is advised to continue vitamin D3 2000 units daily.     - I advised patient to maintain close follow up with Del Wilhelmena Lloyd Sola, FNP for primary care needs.  I spent  26  minutes in the care of the patient today including review of labs from Thyroid  Function, CMP, and other relevant labs ; imaging/biopsy records (current and previous including abstractions from other facilities); face-to-face time discussing  her lab results and symptoms, medications doses, her options of short and long term treatment based on the latest standards of care / guidelines;   and documenting the encounter.  Joyce Martin  participated in the discussions, expressed understanding, and voiced agreement with the above plans.  All questions were answered to her satisfaction. she is encouraged to contact clinic should she have any questions or concerns prior to her return visit.     Follow up plan: Return in about 6 months (around 09/14/2024) for F/U with Pre-visit  Labs.  Ranny Earl, MD Phone: [phone]  Fax: [phone]  -  This note was partially dictated with voice recognition software. Similar sounding words can be transcribed inadequately or may not  be corrected upon review.  03/17/2024, 5:20 PM

## 2024-03-20 LAB — T4, FREE: Free T4: 1.51 ng/dL (ref 0.82–1.77)

## 2024-03-20 LAB — THYROGLOBULIN LEVEL: Thyroglobulin (TG-RIA): <2 ng/mL

## 2024-03-20 LAB — THYROGLOBULIN ANTIBODY: Thyroglobulin Antibody: <1 [IU]/mL (ref 0.0–0.9)

## 2024-03-20 LAB — TSH: TSH: 2 u[IU]/mL (ref 0.450–4.500)

## 2024-04-04 ENCOUNTER — Other Ambulatory Visit: Payer: Self-pay | Admitting: Family Medicine

## 2024-05-20 ENCOUNTER — Other Ambulatory Visit: Payer: Self-pay

## 2024-05-20 ENCOUNTER — Telehealth: Payer: Self-pay

## 2024-05-20 MED ORDER — AMLODIPINE BESYLATE 5 MG PO TABS: 5.0000 mg | ORAL_TABLET | Freq: Every day | ORAL | 3 refills | Status: AC

## 2024-05-20 NOTE — Telephone Encounter (Signed)
 Prescription Request  05/20/2024  LOV: Visit date not found  What is the name of the medication or equipment? amLODipine  (NORVASC ) 5 MG tablet   Have you contacted your pharmacy to request a refill? Yes   Which pharmacy would you like this sent to?  [facility] [address], [place] - [address] [place] [address] Phone: [phone] Fax: [phone]    Patient notified that their request is being sent to the clinical staff for review and that they should receive a response within 2 business days.   Please advise at Mobile [phone] (mobile)

## 2024-05-20 NOTE — Telephone Encounter (Signed)
 Refills sent

## 2024-05-24 ENCOUNTER — Encounter: Payer: Self-pay | Admitting: Family Medicine

## 2024-05-24 ENCOUNTER — Ambulatory Visit: Admitting: Family Medicine

## 2024-05-24 VITALS — BP 146/73 | HR 86 | Ht 68.0 in | Wt 174.0 lb

## 2024-05-24 DIAGNOSIS — M545 Low back pain, unspecified: Secondary | ICD-10-CM | POA: Diagnosis not present

## 2024-05-24 NOTE — Progress Notes (Signed)
 Established Patient Office Visit  Subjective:  Patient ID: Joyce Martin, female    DOB: 26-Jul-1950  Age: 73 y.o. MRN: [id number]  CC:  Chief Complaint  Patient presents with   Back Pain    Radiating back pain for two weeks    HPI Joyce Martin is a 73 y.o. female with c/o of back pain.   The patient presents with back pain for the past 2 weeks. She reports that her back pain typically flares up when the weather changes. She denies any recent falls or injuries. The pain is located in the middle of the back and radiates to both the right and left sides. Pain improves with stretching and worsens with prolonged sitting. Current pain level is 3/10.  She was prescribed a muscle relaxant, methocarbamol 500 mg three times daily, for muscle spasms two weeks ago. She notes ongoing stiffness. The patient reports that aspirin causes nausea, so she avoids it. She has been taking two Extra Strength Tylenol  Arthritis tablets with relief.    Past Medical History:  Diagnosis Date   Elevated BP 07/26/2014   GERD (gastroesophageal reflux disease)    Migraines    Positive test for human papillomavirus (HPV) 2014   on pap   Thyroid  cancer (HCC) 11/2016    Past Surgical History:  Procedure Laterality Date   CATARACT EXTRACTION Bilateral 08/2022   COLONOSCOPY N/A 04/18/2016   Procedure: COLONOSCOPY;  Surgeon: Margo LITTIE Haddock, MD;  Location: [facility];  Service: Endoscopy;  Laterality: N/A;  9:30 AM   COLONOSCOPY WITH PROPOFOL  N/A 06/29/2021   Procedure: COLONOSCOPY WITH PROPOFOL ;  Surgeon: Cindie Carlin POUR, DO;  Location: [facility];  Service: Endoscopy;  Laterality: N/A;  2:30 / ASA II   POLYPECTOMY  06/29/2021   Procedure: POLYPECTOMY;  Surgeon: Cindie Carlin POUR, DO;  Location: [facility];  Service: Endoscopy;;   REFRACTIVE SURGERY Bilateral 03/15/2021   THYROIDECTOMY N/A 02/10/2017   Procedure: THYROIDECTOMY;  Surgeon: Mavis Anes, MD;  Location: [facility];  Service: General;   Laterality: N/A;   TUBAL LIGATION      Family History  Problem Relation Age of Onset   Diabetes Mother    Hypertension Mother    Heart disease Father        heart attack   Cancer Sister        gallbladder   Diabetes Brother    Diabetes Daughter        borderline   Diabetes Maternal Grandmother    Diabetes Paternal Grandmother    Diabetes Paternal Grandfather    COPD Brother    Hypertension Brother    Parkinson's disease Brother    Cancer Brother    Colon cancer Neg Hx     Social History   Socioeconomic History   Marital status: Divorced    Spouse name: Not on file   Number of children: Not on file   Years of education: Not on file   Highest education level: Not on file  Occupational History   Not on file  Tobacco Use   Smoking status: Former    Current packs/day: 0.00    Average packs/day: 0.3 packs/day for 5.0 years (1.3 ttl pk-yrs)    Types: Cigarettes    Start date: 01/29/1974    Quit date: 01/30/1979    Years since quitting: 45.3   Smokeless tobacco: Never  Vaping Use   Vaping status: Never Used  Substance and Sexual Activity   Alcohol use: No  Drug use: No   Sexual activity: Yes    Birth control/protection: Post-menopausal  Other Topics Concern   Not on file  Social History Narrative   Not on file   Social Drivers of Health   Financial Resource Strain: Low Risk  (01/24/2023)   Overall Financial Resource Strain (CARDIA)    Difficulty of Paying Living Expenses: Not hard at all  Food Insecurity: No Food Insecurity (01/24/2023)   Hunger Vital Sign    Worried About Running Out of Food in the Last Year: Never true    Ran Out of Food in the Last Year: Never true  Transportation Needs: No Transportation Needs (01/24/2023)   PRAPARE - Administrator, Civil Service (Medical): No    Lack of Transportation (Non-Medical): No  Physical Activity: Sufficiently Active (01/24/2023)   Exercise Vital Sign    Days of Exercise per Week: 5 days    Minutes of  Exercise per Session: 60 min  Stress: Not on file  Social Connections: Moderately Isolated (01/24/2023)   Social Connection and Isolation Panel    Frequency of Communication with Friends and Family: More than three times a week    Frequency of Social Gatherings with Friends and Family: More than three times a week    Attends Religious Services: Never    Database Administrator or Organizations: Yes    Attends Banker Meetings: Never    Marital Status: Divorced  Catering Manager Violence: Not on file    Outpatient Medications Prior to Visit  Medication Sig Dispense Refill   acetaminophen  (TYLENOL ) 650 MG CR tablet Take 650 mg by mouth every 8 (eight) hours as needed for pain.     amLODipine  (NORVASC ) 5 MG tablet Take 1 tablet (5 mg total) by mouth daily. 30 tablet 3   Cholecalciferol (VITAMIN D ) 50 MCG (2000 UT) CAPS Take 1 capsule (2,000 Units total) by mouth daily with lunch. 90 capsule 3   cloNIDine  (CATAPRES ) 0.1 MG tablet Take 1 tablet (0.1 mg total) by mouth once as needed for up to 1 dose. Take one tablet if blood pressure above 170/100 90 tablet 1   losartan  (COZAAR ) 25 MG tablet Take 1 tablet (25 mg total) by mouth daily. 90 tablet 1   Propylene Glycol (SYSTANE COMPLETE) 0.6 % SOLN Place 1 drop into both eyes daily as needed (dry eyes).     rosuvastatin  (CRESTOR ) 5 MG tablet Take 1 tablet by mouth once daily 90 tablet 0   levothyroxine  (SYNTHROID ) 88 MCG tablet Take 1 tablet (88 mcg total) by mouth daily before breakfast. 90 tablet 1   No facility-administered medications prior to visit.    Allergies  Allergen Reactions   Aspirin Nausea Only   Other Other (See Comments)    COMBID: for stomach -twisting pt's face     ROS Review of Systems  Constitutional:  Negative for chills and fever.  Eyes:  Negative for visual disturbance.  Respiratory:  Negative for chest tightness and shortness of breath.   Musculoskeletal:  Positive for back pain.  Neurological:   Negative for dizziness and headaches.      Objective:    Physical Exam HENT:     Head: Normocephalic.     Mouth/Throat:     Mouth: Mucous membranes are moist.  Cardiovascular:     Rate and Rhythm: Normal rate.     Heart sounds: Normal heart sounds.  Pulmonary:     Effort: Pulmonary effort is normal.  Breath sounds: Normal breath sounds.  Neurological:     Mental Status: She is alert.     BP (!) 146/73   Pulse 86   Ht 5' 8 (1.727 m)   Wt 174 lb (78.9 kg)   SpO2 98%   BMI 26.46 kg/m  Wt Readings from Last 3 Encounters:  05/24/24 174 lb (78.9 kg)  03/17/24 173 lb 6.4 oz (78.7 kg)  02/05/24 174 lb (78.9 kg)    Lab Results  Component Value Date   TSH 2.000 03/10/2024   Lab Results  Component Value Date   WBC 3.5 06/13/2023   HGB 11.2 06/13/2023   HCT 33.9 (L) 06/13/2023   MCV 89 06/13/2023   PLT 245 06/13/2023   Lab Results  Component Value Date   NA 143 06/13/2023   K 4.8 06/13/2023   CO2 23 06/13/2023   GLUCOSE 88 06/13/2023   BUN 16 06/13/2023   CREATININE 1.17 (H) 06/13/2023   BILITOT 0.6 12/17/2022   ALKPHOS 70 12/17/2022   AST 17 12/17/2022   ALT 11 12/17/2022   PROT 7.0 12/17/2022   ALBUMIN 4.3 12/17/2022   CALCIUM  9.1 06/13/2023   ANIONGAP 8 02/11/2017   EGFR 49 (L) 06/13/2023   Lab Results  Component Value Date   CHOL 159 06/13/2023   Lab Results  Component Value Date   HDL 73 06/13/2023   Lab Results  Component Value Date   LDLCALC 75 06/13/2023   Lab Results  Component Value Date   TRIG 55 06/13/2023   Lab Results  Component Value Date   CHOLHDL 2.2 06/13/2023   Lab Results  Component Value Date   HGBA1C 5.8 12/19/2022      Assessment & Plan:  Acute midline low back pain without sciatica Assessment & Plan: -Likely musculoskeletal in nature, exacerbated by weather changes and prolonged sitting. -Pain improves with stretching and responds to Tylenol  Arthritis. -No red flag symptoms (no trauma, neurological  deficits, bowel/bladder changes). -Continue methocarbamol 500 mg TID as prescribed for muscle spasms. -May continue Tylenol  Arthritis as needed, not exceeding 3,000 mg of acetaminophen  per 24 hours. -Avoid aspirin due to nausea. -Start a daily stretching routine focusing on the thoracic and lumbar spine. -Encourage heat therapy (heating pad for 15-20 minutes) and gentle mobility exercises. -Avoid prolonged sitting; change positions frequently. -If no improvement in 2-4 weeks, consider referral to physical therapy. -Return sooner for any red flag symptoms: worsening pain, numbness, weakness, bowel/bladder changes, fever.      Note: This chart has been completed using Engineer, Civil (consulting) software, and while attempts have been made to ensure accuracy, certain words and phrases may not be transcribed as intended.   Follow-up: Return in about 5 months (around 10/22/2024).   Daissy Yerian  Z Bacchus, FNP

## 2024-05-24 NOTE — Patient Instructions (Addendum)
 I appreciate the opportunity to provide care to you today!    Follow up:  5 months  Here are several nonpharmacological interventions for managing back pain: -Physical Activity: Engage in regular low-impact exercises such as walking, swimming, or cycling to strengthen the back muscles and improve flexibility. -Stretching and Strengthening Exercises: Incorporate exercises that focus on strengthening the core and back muscles, as well as stretches to improve flexibility. Specific exercises can include: Cat-Cow stretch Child's pose Knee-to-chest stretch Hamstring stretches Hot and Cold Therapy: -Cold Therapy: Apply ice packs to the affected area for 15-20 minutes to reduce inflammation and numb the pain, especially within the first 48 hours of an injury. -Heat Therapy: After the initial inflammation has subsided, use heat packs or warm baths to relax tight muscles and improve circulation. -Posture Improvement: Maintain proper posture while sitting, standing, and lifting to avoid additional strain on the back. Ergonomic chairs and lumbar support can help. -Weight Management: Maintain a healthy weight to reduce stress on the spine and lower back. -Sleep Hygiene: Ensure proper sleep positioning by using supportive pillows and mattresses to maintain spinal alignment during sleep.    Please follow up if your symptoms worsen or fail to improve.   Attached with your AVS, you will find valuable resources for self-education. I highly recommend dedicating some time to thoroughly examine them.   Please continue to a heart-healthy diet and increase your physical activities. Try to exercise for at least five days a week.    It was a pleasure to see you and I look forward to continuing to work together on your health and well-being. Please do not hesitate to call the office if you need care or have questions about your care.  In case of emergency, please visit the Emergency Department for urgent  care, or contact our clinic at [phone] to schedule an appointment. We're here to help you!   Have a wonderful day and week. With Gratitude, Meade JENEANE Gerlach MSN, FNP-BC, PMHNP-BC

## 2024-05-26 ENCOUNTER — Other Ambulatory Visit: Payer: Self-pay | Admitting: "Endocrinology

## 2024-05-30 DIAGNOSIS — M545 Low back pain, unspecified: Secondary | ICD-10-CM | POA: Insufficient documentation

## 2024-05-30 DIAGNOSIS — M549 Dorsalgia, unspecified: Secondary | ICD-10-CM | POA: Insufficient documentation

## 2024-05-30 NOTE — Assessment & Plan Note (Signed)
-  Likely musculoskeletal in nature, exacerbated by weather changes and prolonged sitting. -Pain improves with stretching and responds to Tylenol  Arthritis. -No red flag symptoms (no trauma, neurological deficits, bowel/bladder changes). -Continue methocarbamol 500 mg TID as prescribed for muscle spasms. -May continue Tylenol  Arthritis as needed, not exceeding 3,000 mg of acetaminophen  per 24 hours. -Avoid aspirin due to nausea. -Start a daily stretching routine focusing on the thoracic and lumbar spine. -Encourage heat therapy (heating pad for 15-20 minutes) and gentle mobility exercises. -Avoid prolonged sitting; change positions frequently. -If no improvement in 2-4 weeks, consider referral to physical therapy. -Return sooner for any red flag symptoms: worsening pain, numbness, weakness, bowel/bladder changes, fever.

## 2024-06-08 ENCOUNTER — Other Ambulatory Visit: Payer: Self-pay | Admitting: Family Medicine

## 2024-06-08 DIAGNOSIS — I10 Essential (primary) hypertension: Secondary | ICD-10-CM

## 2024-09-15 ENCOUNTER — Ambulatory Visit: Admitting: "Endocrinology
